# Patient Record
Sex: Female | Born: 1935 | Race: Black or African American | Hispanic: No | Smoking: Former smoker
Health system: Southern US, Community
[De-identification: ages and names within clinical notes are randomized; demographics above are authoritative.]

## PROBLEM LIST (undated history)

## (undated) DIAGNOSIS — G20 Parkinson's disease: Secondary | ICD-10-CM

## (undated) DIAGNOSIS — G20A1 Parkinson's disease without dyskinesia, without mention of fluctuations: Secondary | ICD-10-CM

## (undated) DIAGNOSIS — E119 Type 2 diabetes mellitus without complications: Secondary | ICD-10-CM

## (undated) DIAGNOSIS — D329 Benign neoplasm of meninges, unspecified: Secondary | ICD-10-CM

## (undated) DIAGNOSIS — E785 Hyperlipidemia, unspecified: Secondary | ICD-10-CM

## (undated) DIAGNOSIS — I10 Essential (primary) hypertension: Secondary | ICD-10-CM

## (undated) HISTORY — PX: ABDOMINAL HYSTERECTOMY: SHX81

## (undated) HISTORY — PX: CHOLECYSTECTOMY: SHX55

## (undated) HISTORY — DX: Parkinson's disease: G20

## (undated) HISTORY — DX: Parkinson's disease without dyskinesia, without mention of fluctuations: G20.A1

## (undated) HISTORY — PX: SHOULDER SURGERY: SHX246

## (undated) HISTORY — DX: Hyperlipidemia, unspecified: E78.5

## (undated) HISTORY — DX: Benign neoplasm of meninges, unspecified: D32.9

## (undated) HISTORY — DX: Type 2 diabetes mellitus without complications: E11.9

## (undated) HISTORY — DX: Essential (primary) hypertension: I10

## (undated) HISTORY — PX: APPENDECTOMY: SHX54

---

## 2004-09-05 ENCOUNTER — Ambulatory Visit: Payer: Self-pay

## 2005-09-26 ENCOUNTER — Ambulatory Visit: Payer: Self-pay | Admitting: Family Medicine

## 2007-07-23 ENCOUNTER — Ambulatory Visit: Payer: Self-pay | Admitting: Family Medicine

## 2008-04-11 ENCOUNTER — Ambulatory Visit: Payer: Self-pay | Admitting: Family Medicine

## 2010-03-13 ENCOUNTER — Ambulatory Visit: Payer: Self-pay | Admitting: Family Medicine

## 2011-02-18 ENCOUNTER — Ambulatory Visit: Payer: Self-pay | Admitting: Family Medicine

## 2011-06-25 ENCOUNTER — Ambulatory Visit: Payer: Self-pay | Admitting: Family Medicine

## 2012-09-14 ENCOUNTER — Ambulatory Visit: Payer: Self-pay | Admitting: Family Medicine

## 2012-09-21 ENCOUNTER — Encounter: Payer: Self-pay | Admitting: Family Medicine

## 2012-10-02 ENCOUNTER — Encounter: Payer: Self-pay | Admitting: Family Medicine

## 2012-12-15 ENCOUNTER — Ambulatory Visit: Payer: Self-pay | Admitting: Family Medicine

## 2013-04-20 ENCOUNTER — Ambulatory Visit: Payer: Self-pay | Admitting: Family Medicine

## 2013-05-02 ENCOUNTER — Ambulatory Visit: Payer: Self-pay | Admitting: Family Medicine

## 2013-06-01 ENCOUNTER — Ambulatory Visit: Payer: Self-pay | Admitting: Family Medicine

## 2014-03-02 ENCOUNTER — Ambulatory Visit: Payer: Self-pay | Admitting: Neurology

## 2014-03-09 ENCOUNTER — Ambulatory Visit: Payer: Self-pay | Admitting: Family Medicine

## 2014-09-27 ENCOUNTER — Ambulatory Visit: Payer: Self-pay | Admitting: Family Medicine

## 2015-04-24 ENCOUNTER — Other Ambulatory Visit: Payer: Self-pay | Admitting: Family Medicine

## 2015-04-24 DIAGNOSIS — Z1231 Encounter for screening mammogram for malignant neoplasm of breast: Secondary | ICD-10-CM

## 2015-04-27 ENCOUNTER — Other Ambulatory Visit: Payer: Self-pay | Admitting: Family Medicine

## 2015-04-27 DIAGNOSIS — D329 Benign neoplasm of meninges, unspecified: Secondary | ICD-10-CM

## 2015-05-03 ENCOUNTER — Ambulatory Visit
Admission: RE | Admit: 2015-05-03 | Discharge: 2015-05-03 | Disposition: A | Discharge status: Home / Self Care | Payer: Medicare PPO | Source: Ambulatory Visit | Attending: Family Medicine | Admitting: Family Medicine

## 2015-05-03 DIAGNOSIS — Z1231 Encounter for screening mammogram for malignant neoplasm of breast: Secondary | ICD-10-CM | POA: Insufficient documentation

## 2015-05-08 ENCOUNTER — Ambulatory Visit
Admission: RE | Admit: 2015-05-08 | Discharge: 2015-05-08 | Disposition: A | Discharge status: Home / Self Care | Payer: Medicare PPO | Source: Ambulatory Visit | Attending: Family Medicine | Admitting: Family Medicine

## 2015-05-08 DIAGNOSIS — I739 Peripheral vascular disease, unspecified: Secondary | ICD-10-CM | POA: Insufficient documentation

## 2015-05-08 DIAGNOSIS — R609 Edema, unspecified: Secondary | ICD-10-CM | POA: Insufficient documentation

## 2015-05-08 DIAGNOSIS — D329 Benign neoplasm of meninges, unspecified: Secondary | ICD-10-CM

## 2015-05-08 MED ORDER — GADOBENATE DIMEGLUMINE 529 MG/ML IV SOLN
13.0000 mL | Freq: Once | INTRAVENOUS | Status: AC | PRN
  Administered 2015-05-08: 13 mL via INTRAVENOUS

## 2015-05-09 NOTE — Progress Notes (Signed)
Patient notified

## 2015-06-07 ENCOUNTER — Other Ambulatory Visit: Payer: Self-pay | Admitting: Family Medicine

## 2016-01-18 ENCOUNTER — Other Ambulatory Visit: Payer: Self-pay | Admitting: Family Medicine

## 2016-01-31 ENCOUNTER — Other Ambulatory Visit: Payer: Self-pay

## 2016-02-07 ENCOUNTER — Other Ambulatory Visit: Payer: Self-pay | Admitting: Family Medicine

## 2016-02-07 MED ORDER — LOSARTAN POTASSIUM 50 MG PO TABS: 50.0000 mg | ORAL_TABLET | Freq: Every day | ORAL | Status: DC

## 2016-02-27 ENCOUNTER — Other Ambulatory Visit: Payer: Self-pay | Admitting: Family Medicine

## 2016-03-06 ENCOUNTER — Ambulatory Visit (INDEPENDENT_AMBULATORY_CARE_PROVIDER_SITE_OTHER): Payer: Medicare Other | Admitting: Family Medicine

## 2016-03-06 ENCOUNTER — Encounter: Payer: Self-pay | Admitting: Family Medicine

## 2016-03-06 VITALS — BP 132/68 | HR 99 | Temp 98.0°F | Resp 18 | Ht 60.0 in | Wt 151.3 lb

## 2016-03-06 DIAGNOSIS — E785 Hyperlipidemia, unspecified: Secondary | ICD-10-CM | POA: Insufficient documentation

## 2016-03-06 DIAGNOSIS — G20 Parkinson's disease: Secondary | ICD-10-CM | POA: Insufficient documentation

## 2016-03-06 DIAGNOSIS — I10 Essential (primary) hypertension: Secondary | ICD-10-CM | POA: Insufficient documentation

## 2016-03-06 DIAGNOSIS — E119 Type 2 diabetes mellitus without complications: Secondary | ICD-10-CM

## 2016-03-06 DIAGNOSIS — IMO0001 Reserved for inherently not codable concepts without codable children: Secondary | ICD-10-CM | POA: Insufficient documentation

## 2016-03-06 DIAGNOSIS — E1165 Type 2 diabetes mellitus with hyperglycemia: Secondary | ICD-10-CM

## 2016-03-06 LAB — POCT GLYCOSYLATED HEMOGLOBIN (HGB A1C): HEMOGLOBIN A1C: 9.3

## 2016-03-06 LAB — POCT UA - MICROALBUMIN
ALBUMIN/CREATININE RATIO, URINE, POC: NEGATIVE
CREATININE, POC: NEGATIVE mg/dL
Microalbumin Ur, POC: NEGATIVE mg/L

## 2016-03-06 NOTE — Progress Notes (Signed)
Name: Cathy Barton   MRN: [id number]    DOB: [birth year]   Date:03/06/2016       Progress Note  Subjective  Chief Complaint  Chief Complaint  Patient presents with  . Follow-up    DM / cough    HPI  Diabetes Mellitus: Pt. Presents for recheck of Diabetes Mellitus. Initial diagnosis was over 30 years ago. She is on Metformin 500 mg daily, diet is mostly balanced meals (doesn't always have the appetite for 3 meals). Recently has gained a few pounds.  She does get a regular eye exam.   Past Medical History  Diagnosis Date  . Hyperlipidemia   . Hypertension   . Diabetes mellitus without complication ([place])   . Parkinson's disease ([place])     Followed by Neurology.  . Meningioma [facility])     Past Surgical History  Procedure Laterality Date  . Appendectomy    . Cholecystectomy    . Abdominal hysterectomy    . Shoulder surgery      Family History  Problem Relation Age of Onset  . Breast cancer Mother 67  . Heart disease Father   . Cancer Brother     Colon CA  . Kidney failure Brother   . Cancer Brother     Prostate CA  . Kidney failure Brother   . Kidney failure Sister   . Kidney failure Sister     Social History   Social History  . Marital Status: Widowed    Spouse Name: N/A  . Number of Children: N/A  . Years of Education: N/A   Occupational History  . Not on file.   Social History Main Topics  . Smoking status: Former Research scientist (life sciences)  . Smokeless tobacco: Never Used  . Alcohol Use: No  . Drug Use: No  . Sexual Activity: No   Other Topics Concern  . Not on file   Social History Narrative     Current outpatient prescriptions:  .  aspirin 81 MG tablet, Take by mouth., Disp: , Rfl:  .  carbidopa-levodopa (SINEMET IR) 25-100 MG tablet, Take 3 tablets by mouth 3 (three) times daily. , Disp: , Rfl:  .  losartan (COZAAR) 50 MG tablet, Take 1 tablet (50 mg total) by mouth daily., Disp: 90 tablet, Rfl: 0 .  metFORMIN (GLUCOPHAGE) 500 MG tablet, TAKE 1 TABLET BY MOUTH  DAILY, Disp: 90 tablet, Rfl: 0 .  pravastatin (PRAVACHOL) 40 MG tablet, TK 1 T PO QD, Disp: , Rfl: 7  Allergies  Allergen Reactions  . Codeine Other (See Comments)     Review of Systems  Constitutional: Negative for fever and chills.  Gastrointestinal: Negative for nausea, vomiting and abdominal pain.  Neurological: Negative for dizziness.    Objective  Filed Vitals:   03/06/16 1036  BP: 132/68  Pulse: 99  Temp: 98 F (36.7 C)  TempSrc: Oral  Resp: 18  Height: 5' (1.524 m)  Weight: 151 lb 4.8 oz (68.629 kg)  SpO2: 90%    Physical Exam  Constitutional: She is oriented to person, place, and time and well-developed, well-nourished, and in no distress.  Cardiovascular: Normal rate and regular rhythm.   Pulmonary/Chest: Effort normal and breath sounds normal.  Abdominal: Soft. Bowel sounds are normal.  Musculoskeletal:       Right ankle: She exhibits no swelling.       Left ankle: She exhibits no swelling.  Trace pitting edema bilateral ankles.   Neurological: She is alert and oriented to person, place,  and time.  Nursing note and vitals reviewed.    Assessment & Plan  1. Diabetes mellitus without complication (HCC) 123456 is elevated at 9.3%, consistent with poorly controlled diabetes. Have advised to increase metformin to 500 mg twice daily, obtain liver and kidney function and adjust medication as appropriate. She will likely need a second oral agent. - Comprehensive Metabolic Panel (CMET) - POCT HgB A1C - POCT Glucose (CBG) - POCT UA - Microalbumin  Note updated patient's past medical, surgical, family, and social history into her chart, reviewed her chart from all scripts and updated to reflect any changes. Padraig Nhan Asad A. [facility] 03/06/2016 11:05 AM

## 2016-03-07 LAB — COMPREHENSIVE METABOLIC PANEL
A/G RATIO: 1.3 (ref 1.2–2.2)
ALBUMIN: 4.4 g/dL (ref 3.5–4.7)
ALT: 9 IU/L (ref 0–32)
AST: 14 IU/L (ref 0–40)
Alkaline Phosphatase: 119 IU/L — ABNORMAL HIGH (ref 39–117)
BILIRUBIN TOTAL: 0.5 mg/dL (ref 0.0–1.2)
BUN / CREAT RATIO: 13 (ref 12–28)
BUN: 12 mg/dL (ref 8–27)
CHLORIDE: 98 mmol/L (ref 96–106)
CO2: 23 mmol/L (ref 18–29)
Calcium: 10 mg/dL (ref 8.7–10.3)
Creatinine, Ser: 0.95 mg/dL (ref 0.57–1.00)
GFR calc non Af Amer: 56 mL/min/{1.73_m2} — ABNORMAL LOW (ref >59)
GFR, EST AFRICAN AMERICAN: 64 mL/min/{1.73_m2} (ref >59)
GLUCOSE: 197 mg/dL — AB (ref 65–99)
Globulin, Total: 3.5 g/dL (ref 1.5–4.5)
Potassium: 4.1 mmol/L (ref 3.5–5.2)
Sodium: 140 mmol/L (ref 134–144)
TOTAL PROTEIN: 7.9 g/dL (ref 6.0–8.5)

## 2016-03-13 ENCOUNTER — Ambulatory Visit (INDEPENDENT_AMBULATORY_CARE_PROVIDER_SITE_OTHER): Payer: Medicare Other | Admitting: Family Medicine

## 2016-03-13 ENCOUNTER — Encounter: Payer: Self-pay | Admitting: Family Medicine

## 2016-03-13 VITALS — BP 122/80 | HR 103 | Temp 97.9°F | Resp 16 | Ht 60.0 in | Wt 152.6 lb

## 2016-03-13 DIAGNOSIS — IMO0001 Reserved for inherently not codable concepts without codable children: Secondary | ICD-10-CM

## 2016-03-13 DIAGNOSIS — E1165 Type 2 diabetes mellitus with hyperglycemia: Secondary | ICD-10-CM

## 2016-03-13 DIAGNOSIS — E785 Hyperlipidemia, unspecified: Secondary | ICD-10-CM

## 2016-03-13 MED ORDER — SITAGLIPTIN PHOSPHATE 50 MG PO TABS: 50.0000 mg | ORAL_TABLET | Freq: Every day | ORAL | Status: DC

## 2016-03-13 MED ORDER — METFORMIN HCL 500 MG PO TABS: 500.0000 mg | ORAL_TABLET | Freq: Two times a day (BID) | ORAL | Status: DC

## 2016-03-13 NOTE — Progress Notes (Signed)
Name: Cathy Barton   MRN: [id number]    DOB: 30-Jul-1930   Date:03/13/2016       Progress Note  Subjective  Chief Complaint  Chief Complaint  Patient presents with  . Follow up labs     1 week recheck    HPI  Diabetes Mellitus Type 2: Pt. Presents for follow up of type 2 Diabetes, poorly controlled (A1c 9.3%). She has been taking Metformin 500 mg twice daily. Will add a second agent to her regimen.   Past Medical History  Diagnosis Date  . Hyperlipidemia   . Hypertension   . Diabetes mellitus without complication ([place])   . Parkinson's disease ([place])     Followed by Neurology.  . Meningioma [facility])     Past Surgical History  Procedure Laterality Date  . Appendectomy    . Cholecystectomy    . Abdominal hysterectomy    . Shoulder surgery      Family History  Problem Relation Age of Onset  . Breast cancer Mother 77  . Heart disease Father   . Cancer Brother     Colon CA  . Kidney failure Brother   . Cancer Brother     Prostate CA  . Kidney failure Brother   . Kidney failure Sister   . Kidney failure Sister     Social History   Social History  . Marital Status: Widowed    Spouse Name: N/A  . Number of Children: N/A  . Years of Education: N/A   Occupational History  . Not on file.   Social History Main Topics  . Smoking status: Former Research scientist (life sciences)  . Smokeless tobacco: Never Used  . Alcohol Use: No  . Drug Use: No  . Sexual Activity: No   Other Topics Concern  . Not on file   Social History Narrative     Current outpatient prescriptions:  .  aspirin 81 MG tablet, Take by mouth., Disp: , Rfl:  .  carbidopa-levodopa (SINEMET IR) 25-100 MG tablet, Take 3 tablets by mouth 3 (three) times daily. , Disp: , Rfl:  .  losartan (COZAAR) 50 MG tablet, Take 1 tablet (50 mg total) by mouth daily., Disp: 90 tablet, Rfl: 0 .  metFORMIN (GLUCOPHAGE) 500 MG tablet, TAKE 1 TABLET BY MOUTH DAILY, Disp: 90 tablet, Rfl: 0 .  pravastatin (PRAVACHOL) 40 MG tablet, TK 1 T PO  QD, Disp: , Rfl: 7  Allergies  Allergen Reactions  . Codeine Other (See Comments)     Review of Systems  Constitutional: Negative for fever, chills and malaise/fatigue.  Eyes: Negative for blurred vision.  Gastrointestinal: Negative for nausea, vomiting and abdominal pain.    Objective  Filed Vitals:   03/13/16 1341  BP: 122/80  Pulse: 103  Temp: 97.9 F (36.6 C)  TempSrc: Oral  Resp: 16  Height: 5' (1.524 m)  Weight: 152 lb 9.6 oz (69.219 kg)  SpO2: 96%    Physical Exam  Constitutional: She is oriented to person, place, and time and well-developed, well-nourished, and in no distress.  Cardiovascular: Normal rate and regular rhythm.   Pulmonary/Chest: Effort normal and breath sounds normal.  Abdominal: Soft. Bowel sounds are normal.  Neurological: She is alert and oriented to person, place, and time.  Nursing note and vitals reviewed.       Assessment & Plan  1. Hyperlipidemia Recheck FLP. - Lipid Profile  2. Uncontrolled type 2 diabetes mellitus without complication, without long-term current use of insulin ([place]) We'll add Januvia  50 mg daily to patient's diabetic regimen. Have already increased metformin to 500 mg twice daily. Recheck A1c in 3-4 months. - sitaGLIPtin (JANUVIA) 50 MG tablet; Take 1 tablet (50 mg total) by mouth daily.  Dispense: 30 tablet; Refill: 2 - metFORMIN (GLUCOPHAGE) 500 MG tablet; Take 1 tablet (500 mg total) by mouth 2 (two) times daily with a meal.  Dispense: 180 tablet; Refill: 0    Theran Vandergrift Asad A. [facility] 03/13/2016 2:13 PM

## 2016-03-25 ENCOUNTER — Other Ambulatory Visit: Payer: Self-pay | Admitting: Family Medicine

## 2016-03-28 LAB — LIPID PANEL
CHOLESTEROL TOTAL: 132 mg/dL (ref 100–199)
Chol/HDL Ratio: 2.6 ratio units (ref 0.0–4.4)
HDL: 50 mg/dL (ref >39)
LDL CALC: 64 mg/dL (ref 0–99)
TRIGLYCERIDES: 91 mg/dL (ref 0–149)
VLDL Cholesterol Cal: 18 mg/dL (ref 5–40)

## 2016-05-01 ENCOUNTER — Other Ambulatory Visit: Payer: Self-pay | Admitting: Family Medicine

## 2016-05-11 ENCOUNTER — Other Ambulatory Visit: Payer: Self-pay | Admitting: Family Medicine

## 2016-05-15 NOTE — Telephone Encounter (Signed)
Medication has been refilled and sent to [facility] [place]

## 2016-05-15 NOTE — Telephone Encounter (Signed)
LOSARTAN IS NEEDING REFILL SAYS THAT THE PHARM HAS SENT REQUEST WITHOUT AND RESPONSE. PHARM IS [place]. PT HAS SEEN THE DR SEVERAL TIMES ALREADY PER PT AND HAS ANOTHER FU IN July WITH HIM

## 2016-05-24 ENCOUNTER — Other Ambulatory Visit: Payer: Self-pay | Admitting: Family Medicine

## 2016-05-24 DIAGNOSIS — Z1231 Encounter for screening mammogram for malignant neoplasm of breast: Secondary | ICD-10-CM

## 2016-06-06 ENCOUNTER — Ambulatory Visit
Admission: RE | Admit: 2016-06-06 | Discharge: 2016-06-06 | Disposition: A | Discharge status: Home / Self Care | Payer: Medicare Other | Source: Ambulatory Visit | Attending: Family Medicine | Admitting: Family Medicine

## 2016-06-06 DIAGNOSIS — Z1231 Encounter for screening mammogram for malignant neoplasm of breast: Secondary | ICD-10-CM | POA: Diagnosis present

## 2016-06-07 ENCOUNTER — Other Ambulatory Visit: Payer: Self-pay | Admitting: Family Medicine

## 2016-06-17 ENCOUNTER — Ambulatory Visit (INDEPENDENT_AMBULATORY_CARE_PROVIDER_SITE_OTHER): Payer: Medicare Other | Admitting: Family Medicine

## 2016-06-17 ENCOUNTER — Encounter: Payer: Self-pay | Admitting: Family Medicine

## 2016-06-17 ENCOUNTER — Other Ambulatory Visit: Payer: Self-pay | Admitting: Family Medicine

## 2016-06-17 DIAGNOSIS — IMO0001 Reserved for inherently not codable concepts without codable children: Secondary | ICD-10-CM

## 2016-06-17 DIAGNOSIS — I10 Essential (primary) hypertension: Secondary | ICD-10-CM | POA: Diagnosis not present

## 2016-06-17 DIAGNOSIS — E785 Hyperlipidemia, unspecified: Secondary | ICD-10-CM

## 2016-06-17 DIAGNOSIS — E1165 Type 2 diabetes mellitus with hyperglycemia: Secondary | ICD-10-CM

## 2016-06-17 LAB — POCT GLYCOSYLATED HEMOGLOBIN (HGB A1C): Hemoglobin A1C: 8

## 2016-06-17 LAB — GLUCOSE, POCT (MANUAL RESULT ENTRY): POC GLUCOSE: 192 mg/dL — AB (ref 70–99)

## 2016-06-17 MED ORDER — METFORMIN HCL ER (OSM) 500 MG PO TB24: 500.0000 mg | ORAL_TABLET | Freq: Every day | ORAL | Status: DC

## 2016-06-17 NOTE — Progress Notes (Signed)
Name: Cathy Barton   MRN: [id number]    DOB: 06-Feb-1930   Date:06/17/2016       Progress Note  Subjective  Chief Complaint  Chief Complaint  Patient presents with  . Hypertension    3 month recheck  . Diabetes    Not taking Januvia  . Hyperlipidemia    Hypertension This is a chronic problem. The problem is unchanged. The problem is controlled. Associated symptoms include headaches (occasional headache early in AM.). Pertinent negatives include no blurred vision, chest pain, palpitations or shortness of breath. Past treatments include angiotensin blockers. There is no history of CVA.  Diabetes She presents for her follow-up diabetic visit. She has type 2 diabetes mellitus. Her disease course has been improving. Hypoglycemia symptoms include headaches (occasional headache early in AM.). Pertinent negatives for diabetes include no blurred vision, no chest pain, no fatigue, no polydipsia, no polyuria and no visual change. Pertinent negatives for diabetic complications include no CVA, heart disease or peripheral neuropathy. Current diabetic treatment includes oral agent (dual therapy). She monitors blood glucose at home 1-2 x per day. Her breakfast blood glucose range is generally 110-130 mg/dl. An ACE inhibitor/angiotensin II receptor blocker is being taken.  Hyperlipidemia This is a chronic problem. The problem is controlled. Recent lipid tests were reviewed and are normal. Pertinent negatives include no chest pain, leg pain, myalgias or shortness of breath. Current antihyperlipidemic treatment includes statins.    Past Medical History  Diagnosis Date  . Hyperlipidemia   . Hypertension   . Diabetes mellitus without complication ([place])   . Parkinson's disease ([place])     Followed by Neurology.  . Meningioma [facility])     Past Surgical History  Procedure Laterality Date  . Appendectomy    . Cholecystectomy    . Abdominal hysterectomy    . Shoulder surgery      Family History  Problem  Relation Age of Onset  . Breast cancer Mother 64  . Heart disease Father   . Cancer Brother     Colon CA  . Kidney failure Brother   . Cancer Brother     Prostate CA  . Kidney failure Brother   . Kidney failure Sister   . Kidney failure Sister     Social History   Social History  . Marital Status: Widowed    Spouse Name: N/A  . Number of Children: N/A  . Years of Education: N/A   Occupational History  . Not on file.   Social History Main Topics  . Smoking status: Former Research scientist (life sciences)  . Smokeless tobacco: Never Used  . Alcohol Use: No  . Drug Use: No  . Sexual Activity: No   Other Topics Concern  . Not on file   Social History Narrative     Current outpatient prescriptions:  .  aspirin 81 MG tablet, Take by mouth., Disp: , Rfl:  .  carbidopa-levodopa (SINEMET IR) 25-100 MG tablet, Take 3 tablets by mouth 3 (three) times daily. , Disp: , Rfl:  .  losartan (COZAAR) 50 MG tablet, Take 1 tablet (50 mg total) by mouth daily., Disp: 90 tablet, Rfl: 0 .  metformin (FORTAMET) 500 MG (OSM) 24 hr tablet, TAKE 1 TABLET BY MOUTH EVERY DAY, Disp: 30 tablet, Rfl: 0 .  pravastatin (PRAVACHOL) 40 MG tablet, TAKE 1 TABLET BY MOUTH EVERY DAY, Disp: 30 tablet, Rfl: 0 .  sitaGLIPtin (JANUVIA) 50 MG tablet, Take 1 tablet (50 mg total) by mouth daily. (Patient not taking: Reported  on 06/17/2016), Disp: 30 tablet, Rfl: 2  Allergies  Allergen Reactions  . Codeine Other (See Comments)     Review of Systems  Constitutional: Negative for fatigue.  Eyes: Negative for blurred vision.  Respiratory: Negative for shortness of breath.   Cardiovascular: Negative for chest pain and palpitations.  Musculoskeletal: Negative for myalgias.  Neurological: Positive for headaches (occasional headache early in AM.).  Endo/Heme/Allergies: Negative for polydipsia.    Objective  Filed Vitals:   06/17/16 1006  BP: 124/68  Pulse: 100  Temp: 98.1 F (36.7 C)  TempSrc: Oral  Resp: 16  Height: 5' (1.524  m)  Weight: 144 lb 1.6 oz (65.363 kg)  SpO2: 95%    Physical Exam  Constitutional: She is oriented to person, place, and time and well-developed, well-nourished, and in no distress.  HENT:  Head: Normocephalic and atraumatic.  Cardiovascular: Normal rate, regular rhythm and normal heart sounds.   No murmur heard. Pulmonary/Chest: Effort normal and breath sounds normal. She has no wheezes.  Abdominal: Soft. Bowel sounds are normal. There is no tenderness.  Musculoskeletal: Normal range of motion. She exhibits no edema or tenderness.  Neurological: She is alert and oriented to person, place, and time.  Psychiatric: Mood, memory, affect and judgment normal.  Nursing note and vitals reviewed.     Assessment & Plan  1. Uncontrolled type 2 diabetes mellitus without complication, without long-term current use of insulin (HCC)  A1c has improved from 9.3% to 8.0%, appropriate and stable for this patient. Advised to continue on Januvia and metformin as prescribed. Recheck A1c in 3-4 months  - POCT HgB A1C - POCT Glucose (CBG) - metformin (FORTAMET) 500 MG (OSM) 24 hr tablet; Take 1 tablet (500 mg total) by mouth daily with breakfast.  Dispense: 90 tablet; Refill: 0  2. Essential hypertension BP stable and responsive to antihypertensive therapy  3. Hyperlipidemia FLP at goal, continue statin therapy.    Kaylena Pacifico Asad A. [facility] 06/17/2016 10:17 AM

## 2016-06-17 NOTE — Telephone Encounter (Signed)
Patient notified of results.

## 2016-07-13 ENCOUNTER — Other Ambulatory Visit: Payer: Self-pay | Admitting: Family Medicine

## 2016-07-13 DIAGNOSIS — E1165 Type 2 diabetes mellitus with hyperglycemia: Principal | ICD-10-CM

## 2016-07-13 DIAGNOSIS — IMO0001 Reserved for inherently not codable concepts without codable children: Secondary | ICD-10-CM

## 2016-08-13 ENCOUNTER — Other Ambulatory Visit: Payer: Self-pay | Admitting: Family Medicine

## 2016-09-18 ENCOUNTER — Ambulatory Visit: Payer: Medicare Other | Admitting: Family Medicine

## 2016-09-18 ENCOUNTER — Encounter: Payer: Self-pay | Admitting: Family Medicine

## 2016-09-18 ENCOUNTER — Ambulatory Visit (INDEPENDENT_AMBULATORY_CARE_PROVIDER_SITE_OTHER): Payer: Medicare Other | Admitting: Family Medicine

## 2016-09-18 VITALS — BP 121/80 | HR 97 | Temp 97.7°F | Resp 16 | Ht 60.0 in | Wt 143.5 lb

## 2016-09-18 DIAGNOSIS — E1165 Type 2 diabetes mellitus with hyperglycemia: Secondary | ICD-10-CM | POA: Diagnosis not present

## 2016-09-18 DIAGNOSIS — E785 Hyperlipidemia, unspecified: Secondary | ICD-10-CM | POA: Diagnosis not present

## 2016-09-18 DIAGNOSIS — Z23 Encounter for immunization: Secondary | ICD-10-CM | POA: Diagnosis not present

## 2016-09-18 DIAGNOSIS — I10 Essential (primary) hypertension: Secondary | ICD-10-CM | POA: Diagnosis not present

## 2016-09-18 DIAGNOSIS — IMO0001 Reserved for inherently not codable concepts without codable children: Secondary | ICD-10-CM

## 2016-09-18 LAB — GLUCOSE, POCT (MANUAL RESULT ENTRY): POC Glucose: 142 mg/dl — AB (ref 70–99)

## 2016-09-18 LAB — POCT GLYCOSYLATED HEMOGLOBIN (HGB A1C): Hemoglobin A1C: 7.9

## 2016-09-18 MED ORDER — SITAGLIPTIN PHOSPHATE 50 MG PO TABS: ORAL_TABLET | ORAL | 0 refills | Status: DC

## 2016-09-18 NOTE — Progress Notes (Signed)
Name: Cathy Barton   MRN: [id number]    DOB: 04/26/1930   Date:09/18/2016       Progress Note  Subjective  Chief Complaint  Chief Complaint  Patient presents with  . Follow-up    3 mo    Hypertension  This is a chronic problem. The problem is unchanged. The problem is controlled. Pertinent negatives include no blurred vision, chest pain, headaches (occasional headache early in AM.), palpitations or shortness of breath. Past treatments include angiotensin blockers. There is no history of CAD/MI or CVA.  Diabetes  She presents for her follow-up diabetic visit. She has type 2 diabetes mellitus. Her disease course has been improving. Pertinent negatives for hypoglycemia include no headaches (occasional headache early in AM.). Pertinent negatives for diabetes include no blurred vision, no chest pain, no fatigue, no polydipsia, no polyuria and no visual change. Pertinent negatives for diabetic complications include no CVA, heart disease or peripheral neuropathy. Current diabetic treatment includes oral agent (dual therapy). She is following a diabetic diet. She monitors blood glucose at home 1-2 x per day. Her breakfast blood glucose range is generally 110-130 mg/dl. An ACE inhibitor/angiotensin II receptor blocker is being taken.  Hyperlipidemia  This is a chronic problem. The problem is controlled. Recent lipid tests were reviewed and are normal. Pertinent negatives include no chest pain, leg pain, myalgias or shortness of breath. Current antihyperlipidemic treatment includes statins.     Past Medical History:  Diagnosis Date  . Diabetes mellitus without complication ([place])   . Hyperlipidemia   . Hypertension   . Meningioma ([place])   . Parkinson's disease ([place])    Followed by Neurology.    Past Surgical History:  Procedure Laterality Date  . ABDOMINAL HYSTERECTOMY    . APPENDECTOMY    . CHOLECYSTECTOMY    . SHOULDER SURGERY      Family History  Problem Relation Age of Onset  .  Breast cancer Mother 79  . Heart disease Father   . Cancer Brother     Colon CA  . Kidney failure Brother   . Cancer Brother     Prostate CA  . Kidney failure Brother   . Kidney failure Sister   . Kidney failure Sister     Social History   Social History  . Marital status: Widowed    Spouse name: N/A  . Number of children: N/A  . Years of education: N/A   Occupational History  . Not on file.   Social History Main Topics  . Smoking status: Former Research scientist (life sciences)  . Smokeless tobacco: Never Used  . Alcohol use No  . Drug use: No  . Sexual activity: No   Other Topics Concern  . Not on file   Social History Narrative  . No narrative on file     Current Outpatient Prescriptions:  .  aspirin 81 MG tablet, Take by mouth., Disp: , Rfl:  .  JANUVIA 50 MG tablet, TAKE 1 TABLET(50 MG) BY MOUTH DAILY, Disp: 30 tablet, Rfl: 0 .  losartan (COZAAR) 50 MG tablet, TAKE 1 TABLET(50 MG) BY MOUTH DAILY, Disp: 90 tablet, Rfl: 0 .  metformin (FORTAMET) 500 MG (OSM) 24 hr tablet, Take 1 tablet (500 mg total) by mouth daily with breakfast., Disp: 90 tablet, Rfl: 0 .  pravastatin (PRAVACHOL) 40 MG tablet, TAKE 1 TABLET BY MOUTH EVERY DAY, Disp: 30 tablet, Rfl: 2 .  carbidopa-levodopa (SINEMET IR) 25-100 MG tablet, Take 3 tablets by mouth 3 (three) times daily. ,  Disp: , Rfl:   Allergies  Allergen Reactions  . Codeine Other (See Comments)     Review of Systems  Constitutional: Negative for fatigue.  Eyes: Negative for blurred vision.  Respiratory: Negative for shortness of breath.   Cardiovascular: Negative for chest pain and palpitations.  Musculoskeletal: Negative for myalgias.  Neurological: Negative for headaches (occasional headache early in AM.).  Endo/Heme/Allergies: Negative for polydipsia.     Objective  Vitals:   09/18/16 1018  BP: 121/80  Pulse: 97  Resp: 16  Temp: 97.7 F (36.5 C)  TempSrc: Oral  SpO2: 97%  Weight: 143 lb 8 oz (65.1 kg)  Height: 5' (1.524 m)     Physical Exam  Constitutional: She is oriented to person, place, and time and well-developed, well-nourished, and in no distress.  HENT:  Head: Normocephalic and atraumatic.  Cardiovascular: Normal rate, regular rhythm, S1 normal, S2 normal and normal heart sounds.   No murmur heard. Pulmonary/Chest: Effort normal and breath sounds normal. She has no wheezes. She has no rhonchi.  Abdominal: Soft. Bowel sounds are normal. There is no tenderness.  Musculoskeletal: Normal range of motion. She exhibits no edema or tenderness.  Neurological: She is alert and oriented to person, place, and time. She displays tremor (resting tremor at 4-5 Hz in both UEs).  Psychiatric: Mood, memory, affect and judgment normal.  Nursing note and vitals reviewed.    Assessment & Plan 1. Uncontrolled type 2 diabetes mellitus without complication, without long-term current use of insulin (HCC) A1c is improving, now at 7.9%, no change in pharmacotherapy. - sitaGLIPtin (JANUVIA) 50 MG tablet; TAKE 1 TABLET(50 MG) BY MOUTH DAILY  Dispense: 90 tablet; Refill: 0  2. Essential hypertension BP stable and controlled on present anti- hypertensive therapy  3. Hyperlipidemia, unspecified hyperlipidemia type FLP at goal from April 2017, repeat in 3 months   Ayaka Andes Asad A. [facility] 09/18/2016 10:29 AM

## 2016-10-01 ENCOUNTER — Ambulatory Visit: Payer: Medicare Other | Admitting: Family Medicine

## 2016-10-02 ENCOUNTER — Other Ambulatory Visit: Payer: Self-pay | Admitting: Neurology

## 2016-10-02 DIAGNOSIS — D320 Benign neoplasm of cerebral meninges: Secondary | ICD-10-CM

## 2016-10-02 DIAGNOSIS — R51 Headache: Secondary | ICD-10-CM

## 2016-10-02 DIAGNOSIS — R519 Headache, unspecified: Secondary | ICD-10-CM

## 2016-10-14 ENCOUNTER — Ambulatory Visit
Admission: RE | Admit: 2016-10-14 | Discharge: 2016-10-14 | Disposition: A | Discharge status: Home / Self Care | Payer: Medicare Other | Source: Ambulatory Visit | Attending: Neurology | Admitting: Neurology

## 2016-10-14 DIAGNOSIS — I6782 Cerebral ischemia: Secondary | ICD-10-CM | POA: Insufficient documentation

## 2016-10-14 DIAGNOSIS — D320 Benign neoplasm of cerebral meninges: Secondary | ICD-10-CM | POA: Diagnosis present

## 2016-10-14 DIAGNOSIS — R519 Headache, unspecified: Secondary | ICD-10-CM

## 2016-10-14 DIAGNOSIS — R51 Headache: Secondary | ICD-10-CM | POA: Insufficient documentation

## 2016-10-14 DIAGNOSIS — G936 Cerebral edema: Secondary | ICD-10-CM | POA: Diagnosis not present

## 2016-10-14 LAB — POCT I-STAT CREATININE: CREATININE: 1 mg/dL (ref 0.44–1.00)

## 2016-10-14 MED ORDER — GADOBENATE DIMEGLUMINE 529 MG/ML IV SOLN
15.0000 mL | Freq: Once | INTRAVENOUS | Status: AC | PRN
  Administered 2016-10-14: 15 mL via INTRAVENOUS

## 2016-10-21 ENCOUNTER — Ambulatory Visit (INDEPENDENT_AMBULATORY_CARE_PROVIDER_SITE_OTHER): Payer: Medicare Other | Admitting: Family Medicine

## 2016-10-21 VITALS — BP 162/72 | HR 92 | Temp 97.5°F | Ht 60.0 in | Wt 144.2 lb

## 2016-10-21 DIAGNOSIS — Z0000 Encounter for general adult medical examination without abnormal findings: Secondary | ICD-10-CM

## 2016-10-21 NOTE — Patient Instructions (Signed)
Ms. Magill , Thank you for taking time to come for your Medicare Wellness Visit. I appreciate your ongoing commitment to your health goals. Please review the following plan we discussed and let me know if I can assist you in the future.   These are the goals we discussed: Goals    . Increase water intake          Starting 10/21/16, I will increase my water intake to 6 glasses a day.       This is a list of the screening recommended for you and due dates:  Health Maintenance  Topic Date Due  . Eye exam for diabetics  12/01/2016*  . Pneumonia vaccines (1 of 2 - PCV13) 12/01/2016*  . Shingles Vaccine  10/21/2026*  . Hemoglobin A1C  03/19/2017  . Complete foot exam   09/18/2017  . Tetanus Vaccine  08/27/2022  . Flu Shot  Completed  . DEXA scan (bone density measurement)  Completed  *Topic was postponed. The date shown is not the original due date.   Preventive Care for Adults  A healthy lifestyle and preventive care can promote health and wellness. Preventive health guidelines for adults include the following key practices.  . A routine yearly physical is a good way to check with your health care provider about your health and preventive screening. It is a chance to share any concerns and updates on your health and to receive a thorough exam.  . Visit your dentist for a routine exam and preventive care every 6 months. Brush your teeth twice a day and floss once a day. Good oral hygiene prevents tooth decay and gum disease.  . The frequency of eye exams is based on your age, health, family medical history, use  of contact lenses, and other factors. Follow your health care provider's ecommendations for frequency of eye exams.  . Eat a healthy diet. Foods like vegetables, fruits, whole grains, low-fat dairy products, and lean protein foods contain the nutrients you need without too many calories. Decrease your intake of foods high in solid fats, added sugars, and salt. Eat the right  amount of calories for you. Get information about a proper diet from your health care provider, if necessary.  . Regular physical exercise is one of the most important things you can do for your health. Most adults should get at least 150 minutes of moderate-intensity exercise (any activity that increases your heart rate and causes you to sweat) each week. In addition, most adults need muscle-strengthening exercises on 2 or more days a week.  Silver Sneakers may be a benefit available to you. To determine eligibility, you may visit the website: www.silversneakers.com or contact program at 226-075-9588 Mon-Fri between 8AM-8PM.   . Maintain a healthy weight. The body mass index (BMI) is a screening tool to identify possible weight problems. It provides an estimate of body fat based on height and weight. Your health care provider can find your BMI and can help you achieve or maintain a healthy weight.   For adults 20 years and older: ? A BMI below 18.5 is considered underweight. ? A BMI of 18.5 to 24.9 is normal. ? A BMI of 25 to 29.9 is considered overweight. ? A BMI of 30 and above is considered obese.   . Maintain normal blood lipids and cholesterol levels by exercising and minimizing your intake of saturated fat. Eat a balanced diet with plenty of fruit and vegetables. Blood tests for lipids and cholesterol should begin at  age 17 and be repeated every 5 years. If your lipid or cholesterol levels are high, you are over 50, or you are at high risk for heart disease, you may need your cholesterol levels checked more frequently. Ongoing high lipid and cholesterol levels should be treated with medicines if diet and exercise are not working.  . If you smoke, find out from your health care provider how to quit. If you do not use tobacco, please do not start.  . If you choose to drink alcohol, please do not consume more than 2 drinks per day. One drink is considered to be 12 ounces (355 mL) of beer, 5  ounces (148 mL) of wine, or 1.5 ounces (44 mL) of liquor.  . If you are 52-13 years old, ask your health care provider if you should take aspirin to prevent strokes.  . Use sunscreen. Apply sunscreen liberally and repeatedly throughout the day. You should seek shade when your shadow is shorter than you. Protect yourself by wearing long sleeves, pants, a wide-brimmed hat, and sunglasses year round, whenever you are outdoors.  . Once a month, do a whole body skin exam, using a mirror to look at the skin on your back. Tell your health care provider of new moles, moles that have irregular borders, moles that are larger than a pencil eraser, or moles that have changed in shape or color.

## 2016-10-21 NOTE — Progress Notes (Signed)
Subjective:   Cathy Barton is a 82 y.o. female who presents for Medicare Annual (Subsequent) preventive examination.  Review of Systems:  N/A  Cardiac Risk Factors include: advanced age (>76men, >43 women);diabetes mellitus;dyslipidemia;hypertension     Objective:     Vitals: BP (!) 162/72 (BP Location: Right Arm)   Pulse 92   Temp 97.5 F (36.4 C) (Oral)   Ht 5' (1.524 m)   Wt 144 lb 4 oz (65.4 kg)   BMI 28.17 kg/m   Body mass index is 28.17 kg/m.   Tobacco History  Smoking Status  . Former Smoker  . Types: Cigarettes  Smokeless Tobacco  . Never Used    Comment: for a short time period     Counseling given: Not Answered   Past Medical History:  Diagnosis Date  . Diabetes mellitus without complication ([place])   . Hyperlipidemia   . Hypertension   . Meningioma ([place])   . Parkinson's disease ([place])    Followed by Neurology.   Past Surgical History:  Procedure Laterality Date  . ABDOMINAL HYSTERECTOMY    . APPENDECTOMY    . CHOLECYSTECTOMY    . SHOULDER SURGERY     Family History  Problem Relation Age of Onset  . Breast cancer Mother 53  . Heart disease Father   . Cancer Brother     Colon CA  . Kidney failure Brother   . Cancer Brother     Prostate CA  . Kidney failure Brother   . Kidney failure Sister   . Kidney failure Sister    History  Sexual Activity  . Sexual activity: No    Outpatient Encounter Prescriptions as of 10/21/2016  Medication Sig  . aspirin 81 MG tablet Take by mouth.  . losartan (COZAAR) 50 MG tablet TAKE 1 TABLET(50 MG) BY MOUTH DAILY  . metformin (FORTAMET) 500 MG (OSM) 24 hr tablet Take 1 tablet (500 mg total) by mouth daily with breakfast.  . pravastatin (PRAVACHOL) 40 MG tablet TAKE 1 TABLET BY MOUTH EVERY DAY  . sitaGLIPtin (JANUVIA) 50 MG tablet TAKE 1 TABLET(50 MG) BY MOUTH DAILY  . carbidopa-levodopa (SINEMET IR) 25-100 MG tablet Take 3 tablets by mouth 3 (three) times daily.    No facility-administered  encounter medications on file as of 10/21/2016.     Activities of Daily Living In your present state of health, do you have any difficulty performing the following activities: 10/21/2016 09/18/2016  Hearing? Tempie Donning  Vision? N Y  Difficulty concentrating or making decisions? N N  Walking or climbing stairs? Y N  Dressing or bathing? N N  Doing errands, shopping? N N  Preparing Food and eating ? N -  Using the Toilet? N -  In the past six months, have you accidently leaked urine? Y -  Do you have problems with loss of bowel control? N -  Managing your Medications? N -  Managing your Finances? N -  Housekeeping or managing your Housekeeping? N -  Some recent data might be hidden    Patient Care Team: Roselee Nova, MD as PCP - General (Family Medicine) Vladimir Crofts, MD as Consulting Physician (Neurology)    Assessment:     Exercise Activities and Dietary recommendations Current Exercise Habits: Home exercise routine, Type of exercise: walking, Time (Minutes): 45, Frequency (Times/Week): 3, Weekly Exercise (Minutes/Week): 135, Intensity: Mild  Goals    . Increase water intake          Starting  10/21/16, I will increase my water intake to 6 glasses a day.      Fall Risk Fall Risk  10/21/2016 09/18/2016 06/17/2016 03/13/2016 03/06/2016  Falls in the past year? No No No No No   Depression Screen PHQ 2/9 Scores 10/21/2016 09/18/2016 06/17/2016 03/13/2016  PHQ - 2 Score 0 0 0 0     Cognitive Function     6CIT Screen 10/21/2016  What Year? 0 points  What month? 0 points  What time? 0 points  Count back from 20 0 points  Months in reverse 0 points  Repeat phrase 6 points  Total Score 6    Immunization History  Administered Date(s) Administered  . Influenza, High Dose Seasonal PF 09/18/2016  . Influenza-Unspecified 08/02/2014  . Pneumococcal Polysaccharide-23 05/02/2013  . Tdap 08/27/2012   Screening Tests Health Maintenance  Topic Date Due  . OPHTHALMOLOGY EXAM   12/01/2016 (Originally 04/25/1940)  . PNA vac Low Risk Adult (1 of 2 - PCV13) 12/01/2016 (Originally 04/25/1997)  . ZOSTAVAX  10/21/2026 (Originally 04/25/1992)  . HEMOGLOBIN A1C  03/19/2017  . FOOT EXAM  09/18/2017  . TETANUS/TDAP  08/27/2022  . INFLUENZA VACCINE  Completed  . DEXA SCAN  Completed      Plan:  I have personally reviewed and addressed the Medicare Annual Wellness questionnaire and have noted the following in the patient's chart:  A. Medical and social history B. Use of alcohol, tobacco or illicit drugs  C. Current medications and supplements D. Functional ability and status E.  Nutritional status F.  Physical activity G. Advance directives H. List of other physicians I.  Hospitalizations, surgeries, and ER visits in previous 12 months J.  [place] such as hearing and vision if needed, cognitive and depression L. Referrals and appointments - none  In addition, I have reviewed and discussed with patient certain preventive protocols, quality metrics, and best practice recommendations. A written personalized care plan for preventive services as well as general preventive health recommendations were provided to patient.  See attached scanned questionnaire for additional information.   Signed,  Fabio Neighbors, LPN Nurse Health Advisor   MD Recommendation: Follow up on eye exam and documentation of Prevnar 13 vaccine. I, as supervising physician, have reviewed the nurse health advisor's Medicare Wellness Visit note for this patient and concur with the findings and recommendations listed above.  Signed Syed Asad A. Manuella Ghazi MD Attending Physician.

## 2016-11-26 ENCOUNTER — Other Ambulatory Visit: Payer: Self-pay | Admitting: Family Medicine

## 2016-11-29 ENCOUNTER — Other Ambulatory Visit: Payer: Self-pay | Admitting: Family Medicine

## 2016-11-29 DIAGNOSIS — E1165 Type 2 diabetes mellitus with hyperglycemia: Principal | ICD-10-CM

## 2016-11-29 DIAGNOSIS — IMO0001 Reserved for inherently not codable concepts without codable children: Secondary | ICD-10-CM

## 2017-01-10 ENCOUNTER — Ambulatory Visit (INDEPENDENT_AMBULATORY_CARE_PROVIDER_SITE_OTHER): Payer: Medicare Other | Admitting: Family Medicine

## 2017-01-10 ENCOUNTER — Encounter: Payer: Self-pay | Admitting: Family Medicine

## 2017-01-10 DIAGNOSIS — J069 Acute upper respiratory infection, unspecified: Secondary | ICD-10-CM | POA: Diagnosis not present

## 2017-01-10 LAB — POCT INFLUENZA A/B
INFLUENZA A, POC: NEGATIVE
INFLUENZA B, POC: NEGATIVE

## 2017-01-10 MED ORDER — AMOXICILLIN-POT CLAVULANATE 875-125 MG PO TABS: 1.0000 | ORAL_TABLET | Freq: Two times a day (BID) | ORAL | 0 refills | Status: DC

## 2017-01-10 MED ORDER — BENZONATATE 200 MG PO CAPS: 200.0000 mg | ORAL_CAPSULE | Freq: Two times a day (BID) | ORAL | 0 refills | Status: AC | PRN

## 2017-01-10 NOTE — Progress Notes (Signed)
Name: Cathy Barton   MRN: [id number]    DOB: [birth year]   Date:01/10/2017       Progress Note  Subjective  Chief Complaint  Chief Complaint  Patient presents with  . Nasal Congestion    x3 days , pt has been taking OTC  medications to treat symptoms    Sore Throat   This is a new problem. The current episode started in the past 7 days (2 days ago). There has been no fever (elevated temperature). Associated symptoms include congestion, coughing, a hoarse voice and swollen glands. Pertinent negatives include no shortness of breath. She has tried nothing (Coricidin, Chloraspetic.) for the symptoms.    Past Medical History:  Diagnosis Date  . Diabetes mellitus without complication ([place])   . Hyperlipidemia   . Hypertension   . Meningioma ([place])   . Parkinson's disease ([place])    Followed by Neurology.    Past Surgical History:  Procedure Laterality Date  . ABDOMINAL HYSTERECTOMY    . APPENDECTOMY    . CHOLECYSTECTOMY    . SHOULDER SURGERY      Family History  Problem Relation Age of Onset  . Breast cancer Mother 70  . Heart disease Father   . Cancer Brother     Colon CA  . Kidney failure Brother   . Cancer Brother     Prostate CA  . Kidney failure Brother   . Kidney failure Sister   . Kidney failure Sister     Social History   Social History  . Marital status: Widowed    Spouse name: N/A  . Number of children: N/A  . Years of education: N/A   Occupational History  . Not on file.   Social History Main Topics  . Smoking status: Former Smoker    Types: Cigarettes  . Smokeless tobacco: Never Used     Comment: for a short time period  . Alcohol use No  . Drug use: No  . Sexual activity: No   Other Topics Concern  . Not on file   Social History Narrative  . No narrative on file     Current Outpatient Prescriptions:  .  aspirin 81 MG tablet, Take by mouth., Disp: , Rfl:  .  losartan (COZAAR) 50 MG tablet, TAKE 1 TABLET(50 MG) BY MOUTH DAILY, Disp: 90  tablet, Rfl: 0 .  metFORMIN (GLUCOPHAGE) 500 MG tablet, TAKE 1 TABLET(500 MG) BY MOUTH TWICE DAILY WITH A MEAL, Disp: 180 tablet, Rfl: 0 .  pravastatin (PRAVACHOL) 40 MG tablet, TAKE 1 TABLET BY MOUTH EVERY DAY, Disp: 30 tablet, Rfl: 2 .  sitaGLIPtin (JANUVIA) 50 MG tablet, TAKE 1 TABLET(50 MG) BY MOUTH DAILY, Disp: 90 tablet, Rfl: 0 .  carbidopa-levodopa (SINEMET IR) 25-100 MG tablet, Take 3 tablets by mouth 3 (three) times daily. , Disp: , Rfl:   Allergies  Allergen Reactions  . Codeine Other (See Comments)     Review of Systems  Constitutional: Negative for chills and fever.  HENT: Positive for congestion, hoarse voice and sore throat.   Respiratory: Positive for cough. Negative for shortness of breath.      Objective  Vitals:   01/10/17 0923  BP: (!) 151/73  Pulse: 98  Resp: 17  Temp: 99 F (37.2 C)  TempSrc: Oral  SpO2: 96%  Weight: 142 lb 3.2 oz (64.5 kg)  Height: 5' (1.524 m)    Physical Exam  Constitutional: She is well-developed, well-nourished, and in no distress.  HENT:  Head:  Normocephalic and atraumatic.  Left Ear: Tympanic membrane and ear canal normal.  Nose: Right sinus exhibits frontal sinus tenderness. Right sinus exhibits no maxillary sinus tenderness. Left sinus exhibits frontal sinus tenderness.  Mouth/Throat: Posterior oropharyngeal erythema present.  Left ear canal with cerumen impaction. Right ear canal normal, no erythema, draingae, TM normal appearance  Cardiovascular: Normal rate, regular rhythm, S1 normal and S2 normal.   No murmur heard. Pulmonary/Chest: Effort normal and breath sounds normal. No respiratory distress. She has no wheezes. She has no rhonchi.  Nursing note and vitals reviewed.     Assessment & Plan  1. URI with cough and congestion Point-of-care flu testing is negative, started on Augmentin for treatment of URI with benzonatate for relief of coughing - POCT Influenza A/B - amoxicillin-clavulanate (AUGMENTIN) 875-125  MG tablet; Take 1 tablet by mouth 2 (two) times daily.  Dispense: 20 tablet; Refill: 0 - benzonatate (TESSALON) 200 MG capsule; Take 1 capsule (200 mg total) by mouth 2 (two) times daily as needed for cough.  Dispense: 14 capsule; Refill: 0   Rayleen Wyrick Asad A. [facility] 01/10/2017 9:51 AM

## 2017-01-14 ENCOUNTER — Ambulatory Visit: Payer: Medicare Other | Admitting: Family Medicine

## 2017-02-07 ENCOUNTER — Encounter: Payer: Self-pay | Admitting: Family Medicine

## 2017-02-07 ENCOUNTER — Ambulatory Visit (INDEPENDENT_AMBULATORY_CARE_PROVIDER_SITE_OTHER): Payer: Medicare Other | Admitting: Family Medicine

## 2017-02-07 VITALS — BP 141/81 | HR 97 | Temp 98.6°F | Resp 17 | Ht 60.0 in | Wt 140.6 lb

## 2017-02-07 DIAGNOSIS — R000 Tachycardia, unspecified: Secondary | ICD-10-CM

## 2017-02-07 DIAGNOSIS — E1165 Type 2 diabetes mellitus with hyperglycemia: Secondary | ICD-10-CM

## 2017-02-07 DIAGNOSIS — I10 Essential (primary) hypertension: Secondary | ICD-10-CM | POA: Diagnosis not present

## 2017-02-07 DIAGNOSIS — IMO0001 Reserved for inherently not codable concepts without codable children: Secondary | ICD-10-CM

## 2017-02-07 DIAGNOSIS — E7800 Pure hypercholesterolemia, unspecified: Secondary | ICD-10-CM | POA: Diagnosis not present

## 2017-02-07 LAB — POCT GLYCOSYLATED HEMOGLOBIN (HGB A1C): Hemoglobin A1C: 7.8

## 2017-02-07 LAB — GLUCOSE, POCT (MANUAL RESULT ENTRY): POC Glucose: 203 mg/dl — AB (ref 70–99)

## 2017-02-07 MED ORDER — PRAVASTATIN SODIUM 40 MG PO TABS: 40.0000 mg | ORAL_TABLET | Freq: Every day | ORAL | 0 refills | Status: DC

## 2017-02-07 MED ORDER — METFORMIN HCL 500 MG PO TABS: ORAL_TABLET | ORAL | 0 refills | Status: DC

## 2017-02-07 MED ORDER — SITAGLIPTIN PHOSPHATE 100 MG PO TABS: 100.0000 mg | ORAL_TABLET | Freq: Every day | ORAL | 0 refills | Status: DC

## 2017-02-07 MED ORDER — LOSARTAN POTASSIUM 50 MG PO TABS: ORAL_TABLET | ORAL | 0 refills | Status: DC

## 2017-02-07 NOTE — Progress Notes (Signed)
Name: Cathy Barton   MRN: [id number]    DOB: 1930-03-09   Date:02/07/2017       Progress Note  Subjective  Chief Complaint  Chief Complaint  Patient presents with  . Follow-up    1 mo  . Medication Refill    Hypertension  This is a chronic problem. The problem is unchanged. The problem is controlled. Associated symptoms include palpitations (had some palpitations last month when she took antibiotics, resolved with discontinuation.). Pertinent negatives include no blurred vision, chest pain, headaches or shortness of breath. Past treatments include angiotensin blockers. There is no history of kidney disease, CAD/MI or CVA.  Diabetes  She presents for her follow-up diabetic visit. She has type 2 diabetes mellitus. Her disease course has been improving. Pertinent negatives for hypoglycemia include no headaches. Associated symptoms include foot paresthesias (occasional paresthesias in her feet). Pertinent negatives for diabetes include no blurred vision, no chest pain, no fatigue, no polydipsia, no polyuria and no visual change. Pertinent negatives for diabetic complications include no CVA, heart disease or peripheral neuropathy. Current diabetic treatment includes oral agent (dual therapy). She is following a diabetic diet. She monitors blood glucose at home 1-2 x per day. Her breakfast blood glucose range is generally 180-200 mg/dl. An ACE inhibitor/angiotensin II receptor blocker is being taken.  Hyperlipidemia  This is a chronic problem. The problem is controlled. Recent lipid tests were reviewed and are normal. Pertinent negatives include no chest pain, leg pain, myalgias or shortness of breath. Current antihyperlipidemic treatment includes statins.     Past Medical History:  Diagnosis Date  . Diabetes mellitus without complication ([place])   . Hyperlipidemia   . Hypertension   . Meningioma ([place])   . Parkinson's disease ([place])    Followed by Neurology.    Past Surgical History:   Procedure Laterality Date  . ABDOMINAL HYSTERECTOMY    . APPENDECTOMY    . CHOLECYSTECTOMY    . SHOULDER SURGERY      Family History  Problem Relation Age of Onset  . Breast cancer Mother 27  . Heart disease Father   . Cancer Brother     Colon CA  . Kidney failure Brother   . Cancer Brother     Prostate CA  . Kidney failure Brother   . Kidney failure Sister   . Kidney failure Sister     Social History   Social History  . Marital status: Widowed    Spouse name: N/A  . Number of children: N/A  . Years of education: N/A   Occupational History  . Not on file.   Social History Main Topics  . Smoking status: Former Smoker    Types: Cigarettes  . Smokeless tobacco: Never Used     Comment: for a short time period  . Alcohol use No  . Drug use: No  . Sexual activity: No   Other Topics Concern  . Not on file   Social History Narrative  . No narrative on file     Current Outpatient Prescriptions:  .  aspirin 81 MG tablet, Take by mouth., Disp: , Rfl:  .  losartan (COZAAR) 50 MG tablet, TAKE 1 TABLET(50 MG) BY MOUTH DAILY, Disp: 90 tablet, Rfl: 0 .  metFORMIN (GLUCOPHAGE) 500 MG tablet, TAKE 1 TABLET(500 MG) BY MOUTH TWICE DAILY WITH A MEAL, Disp: 180 tablet, Rfl: 0 .  pravastatin (PRAVACHOL) 40 MG tablet, TAKE 1 TABLET BY MOUTH EVERY DAY, Disp: 30 tablet, Rfl: 2 .  sitaGLIPtin (  JANUVIA) 50 MG tablet, TAKE 1 TABLET(50 MG) BY MOUTH DAILY, Disp: 90 tablet, Rfl: 0 .  amoxicillin-clavulanate (AUGMENTIN) 875-125 MG tablet, Take 1 tablet by mouth 2 (two) times daily. (Patient not taking: Reported on 02/07/2017), Disp: 20 tablet, Rfl: 0 .  carbidopa-levodopa (SINEMET IR) 25-100 MG tablet, Take 3 tablets by mouth 3 (three) times daily. , Disp: , Rfl:   Allergies  Allergen Reactions  . Codeine Other (See Comments)     Review of Systems  Constitutional: Negative for fatigue.  Eyes: Negative for blurred vision.  Respiratory: Negative for shortness of breath.    Cardiovascular: Positive for palpitations (had some palpitations last month when she took antibiotics, resolved with discontinuation.). Negative for chest pain.  Musculoskeletal: Negative for myalgias.  Neurological: Negative for headaches.  Endo/Heme/Allergies: Negative for polydipsia.    Objective  Vitals:   02/07/17 0942  BP: (!) 141/81  Pulse: 97  Resp: 17  Temp: 98.6 F (37 C)  TempSrc: Oral  SpO2: 95%  Weight: 140 lb 9.6 oz (63.8 kg)  Height: 5' (1.524 m)    Physical Exam  Constitutional: She is oriented to person, place, and time and well-developed, well-nourished, and in no distress.  HENT:  Head: Normocephalic and atraumatic.  Cardiovascular: Regular rhythm, S1 normal, S2 normal and normal heart sounds.  Tachycardia present.   No murmur heard. Pulmonary/Chest: Effort normal and breath sounds normal. She has no wheezes. She has no rhonchi.  Abdominal: Soft. Bowel sounds are normal. There is no tenderness.  Musculoskeletal: Normal range of motion. She exhibits no edema or tenderness.       Right ankle: She exhibits no swelling.       Left ankle: She exhibits no swelling.  Neurological: She is alert and oriented to person, place, and time. She displays tremor (resting tremor at 4-5 Hz in both UEs).  Psychiatric: Mood, memory, affect and judgment normal.  Nursing note and vitals reviewed.      Assessment & Plan  1. Uncontrolled type 2 diabetes mellitus without complication, without long-term current use of insulin (HCC) Hemoglobin A1c is marginally improved at 7.8%, increased Januvia to 100 mg daily, continue on metformin at same dosage - sitaGLIPtin (JANUVIA) 100 MG tablet; Take 1 tablet (100 mg total) by mouth daily.  Dispense: 90 tablet; Refill: 0 - metFORMIN (GLUCOPHAGE) 500 MG tablet; TAKE 1 TABLET(500 MG) BY MOUTH TWICE DAILY WITH A MEAL  Dispense: 180 tablet; Refill: 0  2. Essential hypertension BP stable but elevated above goal. Recheck in one month -  losartan (COZAAR) 50 MG tablet; TAKE 1 TABLET(50 MG) BY MOUTH DAILY  Dispense: 90 tablet; Refill: 0  3. Pure hypercholesterolemia  - Lipid panel - COMPLETE METABOLIC PANEL WITH GFR - pravastatin (PRAVACHOL) 40 MG tablet; Take 1 tablet (40 mg total) by mouth daily.  Dispense: 90 tablet; Refill: 0  4. Tachycardia EKG shows normal sinus rhythm, patient reassured if she has any recurrence of palpitations, will need referral to cardiology   Cathy Barton Asad A. [facility] 02/07/2017 10:10 AM

## 2017-05-10 ENCOUNTER — Other Ambulatory Visit: Payer: Self-pay | Admitting: Family Medicine

## 2017-05-10 DIAGNOSIS — E7800 Pure hypercholesterolemia, unspecified: Secondary | ICD-10-CM

## 2017-05-10 DIAGNOSIS — E1165 Type 2 diabetes mellitus with hyperglycemia: Secondary | ICD-10-CM

## 2017-05-10 DIAGNOSIS — IMO0001 Reserved for inherently not codable concepts without codable children: Secondary | ICD-10-CM

## 2017-05-10 DIAGNOSIS — I10 Essential (primary) hypertension: Secondary | ICD-10-CM

## 2017-07-21 ENCOUNTER — Other Ambulatory Visit: Payer: Self-pay | Admitting: Family Medicine

## 2017-08-08 ENCOUNTER — Other Ambulatory Visit: Payer: Self-pay | Admitting: Family Medicine

## 2017-08-08 DIAGNOSIS — E1165 Type 2 diabetes mellitus with hyperglycemia: Principal | ICD-10-CM

## 2017-08-08 DIAGNOSIS — IMO0001 Reserved for inherently not codable concepts without codable children: Secondary | ICD-10-CM

## 2017-08-11 ENCOUNTER — Other Ambulatory Visit: Payer: Self-pay | Admitting: Family Medicine

## 2017-08-11 DIAGNOSIS — I10 Essential (primary) hypertension: Secondary | ICD-10-CM

## 2017-08-13 ENCOUNTER — Telehealth: Payer: Self-pay

## 2017-08-13 DIAGNOSIS — IMO0001 Reserved for inherently not codable concepts without codable children: Secondary | ICD-10-CM

## 2017-08-13 DIAGNOSIS — E1165 Type 2 diabetes mellitus with hyperglycemia: Secondary | ICD-10-CM

## 2017-08-13 DIAGNOSIS — I10 Essential (primary) hypertension: Secondary | ICD-10-CM

## 2017-08-13 MED ORDER — SITAGLIPTIN PHOSPHATE 100 MG PO TABS: 100.0000 mg | ORAL_TABLET | Freq: Every day | ORAL | 0 refills | Status: DC

## 2017-08-13 MED ORDER — LOSARTAN POTASSIUM 50 MG PO TABS: ORAL_TABLET | ORAL | 0 refills | Status: DC

## 2017-08-13 NOTE — Telephone Encounter (Signed)
Medication has been refilled and sent to [facility]

## 2017-09-16 ENCOUNTER — Other Ambulatory Visit: Payer: Self-pay | Admitting: Family Medicine

## 2017-09-16 DIAGNOSIS — I10 Essential (primary) hypertension: Secondary | ICD-10-CM

## 2017-09-18 ENCOUNTER — Ambulatory Visit (INDEPENDENT_AMBULATORY_CARE_PROVIDER_SITE_OTHER): Payer: Medicare Other | Admitting: Family Medicine

## 2017-09-18 ENCOUNTER — Encounter: Payer: Self-pay | Admitting: Family Medicine

## 2017-09-18 VITALS — BP 116/64 | HR 91 | Temp 98.0°F | Resp 16 | Wt 140.0 lb

## 2017-09-18 DIAGNOSIS — R1013 Epigastric pain: Secondary | ICD-10-CM | POA: Diagnosis not present

## 2017-09-18 DIAGNOSIS — R9431 Abnormal electrocardiogram [ECG] [EKG]: Secondary | ICD-10-CM | POA: Diagnosis not present

## 2017-09-18 LAB — CBC WITH DIFFERENTIAL/PLATELET
BASOS ABS: 61 {cells}/uL (ref 0–200)
Basophils Relative: 0.6 %
EOS PCT: 1.1 %
Eosinophils Absolute: 111 cells/uL (ref 15–500)
HEMATOCRIT: 34.6 % — AB (ref 35.0–45.0)
HEMOGLOBIN: 11.3 g/dL — AB (ref 11.7–15.5)
LYMPHS ABS: 3091 {cells}/uL (ref 850–3900)
MCH: 27.5 pg (ref 27.0–33.0)
MCHC: 32.7 g/dL (ref 32.0–36.0)
MCV: 84.2 fL (ref 80.0–100.0)
MPV: 11.4 fL (ref 7.5–12.5)
Monocytes Relative: 8.4 %
NEUTROS ABS: 5989 {cells}/uL (ref 1500–7800)
Neutrophils Relative %: 59.3 %
Platelets: 250 10*3/uL (ref 140–400)
RBC: 4.11 10*6/uL (ref 3.80–5.10)
RDW: 13.9 % (ref 11.0–15.0)
Total Lymphocyte: 30.6 %
WBC: 10.1 10*3/uL (ref 3.8–10.8)
WBCMIX: 848 {cells}/uL (ref 200–950)

## 2017-09-18 LAB — COMPLETE METABOLIC PANEL WITH GFR
AG RATIO: 1.3 (calc) (ref 1.0–2.5)
ALBUMIN MSPROF: 4.1 g/dL (ref 3.6–5.1)
ALKALINE PHOSPHATASE (APISO): 105 U/L (ref 33–130)
ALT: 3 U/L — ABNORMAL LOW (ref 6–29)
AST: 13 U/L (ref 10–35)
BILIRUBIN TOTAL: 0.5 mg/dL (ref 0.2–1.2)
BUN / CREAT RATIO: 18 (calc) (ref 6–22)
BUN: 19 mg/dL (ref 7–25)
CALCIUM: 9.6 mg/dL (ref 8.6–10.4)
CO2: 26 mmol/L (ref 20–32)
Chloride: 101 mmol/L (ref 98–110)
Creat: 1.07 mg/dL — ABNORMAL HIGH (ref 0.60–0.88)
GFR, EST AFRICAN AMERICAN: 55 mL/min/{1.73_m2} — AB (ref >=60)
GFR, Est Non African American: 47 mL/min/{1.73_m2} — ABNORMAL LOW (ref >=60)
GLOBULIN: 3.2 g/dL (ref 1.9–3.7)
Glucose, Bld: 159 mg/dL — ABNORMAL HIGH (ref 65–139)
Potassium: 4 mmol/L (ref 3.5–5.3)
SODIUM: 134 mmol/L — AB (ref 135–146)
Total Protein: 7.3 g/dL (ref 6.1–8.1)

## 2017-09-18 LAB — LIPASE: Lipase: 19 U/L (ref 7–60)

## 2017-09-18 LAB — AMYLASE: AMYLASE: 694 U/L — AB (ref 21–101)

## 2017-09-18 MED ORDER — PANTOPRAZOLE SODIUM 20 MG PO TBEC: 20.0000 mg | DELAYED_RELEASE_TABLET | Freq: Every day | ORAL | 0 refills | Status: DC

## 2017-09-18 NOTE — Progress Notes (Signed)
Name: Cathy Barton   MRN: [id number]    DOB: 29-Jun-1930   Date:09/18/2017       Progress Note  Subjective  Chief Complaint  Chief Complaint  Patient presents with  . Abdominal Pain    comes and goes. Started 2 weeks ago. upper mid stoamch pain.     Abdominal Pain  This is a new problem. The current episode started 1 to 4 weeks ago (2 weeks ago). The onset quality is gradual. The problem occurs intermittently. The problem has been unchanged. The pain is located in the epigastric region. The pain is moderate. The quality of the pain is burning. Pertinent negatives include no belching, fever, flatus, hematochezia, hematuria, nausea or vomiting. Exacerbated by: comes on when she has not eaten and also when she takes her medications. The pain is relieved by eating. She has tried antacids and H2 blockers for the symptoms. The treatment provided moderate relief.      Past Medical History:  Diagnosis Date  . Diabetes mellitus without complication ([place])   . Hyperlipidemia   . Hypertension   . Meningioma ([place])   . Parkinson's disease ([place])    Followed by Neurology.    Past Surgical History:  Procedure Laterality Date  . ABDOMINAL HYSTERECTOMY    . APPENDECTOMY    . CHOLECYSTECTOMY    . SHOULDER SURGERY      Family History  Problem Relation Age of Onset  . Breast cancer Mother 44  . Heart disease Father   . Cancer Brother        Colon CA  . Kidney failure Brother   . Cancer Brother        Prostate CA  . Kidney failure Brother   . Kidney failure Sister   . Kidney failure Sister     Social History   Social History  . Marital status: Widowed    Spouse name: N/A  . Number of children: N/A  . Years of education: N/A   Occupational History  . Not on file.   Social History Main Topics  . Smoking status: Former Smoker    Types: Cigarettes  . Smokeless tobacco: Never Used     Comment: for a short time period  . Alcohol use No  . Drug use: No  . Sexual activity: No    Other Topics Concern  . Not on file   Social History Narrative  . No narrative on file     Current Outpatient Prescriptions:  .  aspirin 81 MG tablet, Take by mouth., Disp: , Rfl:  .  carbidopa-levodopa (SINEMET IR) 25-100 MG tablet, Take 2 tablets by mouth 3 (three) times daily., Disp: , Rfl: 1 .  ibuprofen (ADVIL,MOTRIN) 200 MG tablet, Take 1 tablet by mouth every 6 (six) hours as needed., Disp: , Rfl:  .  losartan (COZAAR) 50 MG tablet, TAKE 1 TABLET(50 MG) BY MOUTH DAILY, Disp: 30 tablet, Rfl: 0 .  metFORMIN (GLUCOPHAGE) 500 MG tablet, TAKE 1 TABLET(500 MG) BY MOUTH TWICE DAILY WITH A MEAL, Disp: 180 tablet, Rfl: 0 .  pravastatin (PRAVACHOL) 40 MG tablet, Take 1 tablet (40 mg total) by mouth daily., Disp: 90 tablet, Rfl: 0 .  ranitidine (ZANTAC) 150 MG capsule, Take 1 capsule by mouth daily as needed., Disp: , Rfl:  .  sitaGLIPtin (JANUVIA) 100 MG tablet, Take 1 tablet (100 mg total) by mouth daily., Disp: 30 tablet, Rfl: 0 .  carbidopa-levodopa (SINEMET IR) 25-100 MG tablet, Take 3 tablets by mouth 3 (  three) times daily. , Disp: , Rfl:   Allergies  Allergen Reactions  . Codeine Other (See Comments)     Review of Systems  Constitutional: Negative for fever.  Gastrointestinal: Positive for abdominal pain. Negative for flatus, hematochezia, nausea and vomiting.  Genitourinary: Negative for hematuria.      Objective  Vitals:   09/18/17 1417  BP: 116/64  Pulse: 91  Resp: 16  Temp: 98 F (36.7 C)  TempSrc: Oral  SpO2: 95%  Weight: 140 lb (63.5 kg)    Physical Exam  Constitutional: She is oriented to person, place, and time and well-developed, well-nourished, and in no distress.  Cardiovascular: Normal rate, regular rhythm and normal heart sounds.   No murmur heard. Pulmonary/Chest: Effort normal and breath sounds normal.  Abdominal: Soft. Bowel sounds are normal. There is tenderness in the epigastric area. There is no rebound.  Neurological: She is alert and  oriented to person, place, and time.  Skin: Skin is dry.  Psychiatric: Mood, memory, affect and judgment normal.  Nursing note and vitals reviewed.     Assessment & Plan  1. Epigastric pain Suspect gastritis vs gastric ulcer vs other intraabdominal pathology, Obtain pertinent work up, start on PPI - CBC with Differential/Platelet - COMPLETE METABOLIC PANEL WITH GFR - Amylase - Lipase - EKG 12-Lead - US Abdomen Complete; Future - pantoprazole (PROTONIX) 20 MG tablet; Take 1 tablet (20 mg total) by mouth daily.  Dispense: 30 tablet; Refill: 0  2. Abnormal finding on EKG EKG shows changes in V2, referral to cardiology for follow up - Ambulatory referral to Cardiology  [facility] 09/18/2017 2:41 PM

## 2017-09-19 ENCOUNTER — Emergency Department
Admission: EM | Admit: 2017-09-19 | Discharge: 2017-09-19 | Disposition: A | Discharge status: Home / Self Care | Payer: Medicare Other | Attending: Emergency Medicine | Admitting: Emergency Medicine

## 2017-09-19 ENCOUNTER — Telehealth: Payer: Self-pay

## 2017-09-19 ENCOUNTER — Encounter: Payer: Self-pay | Admitting: Emergency Medicine

## 2017-09-19 DIAGNOSIS — E119 Type 2 diabetes mellitus without complications: Secondary | ICD-10-CM | POA: Insufficient documentation

## 2017-09-19 DIAGNOSIS — I10 Essential (primary) hypertension: Secondary | ICD-10-CM | POA: Insufficient documentation

## 2017-09-19 DIAGNOSIS — G20 Parkinson's disease: Secondary | ICD-10-CM | POA: Diagnosis not present

## 2017-09-19 DIAGNOSIS — R748 Abnormal levels of other serum enzymes: Secondary | ICD-10-CM | POA: Insufficient documentation

## 2017-09-19 DIAGNOSIS — Z008 Encounter for other general examination: Secondary | ICD-10-CM | POA: Diagnosis present

## 2017-09-19 DIAGNOSIS — Z87891 Personal history of nicotine dependence: Secondary | ICD-10-CM | POA: Diagnosis not present

## 2017-09-19 DIAGNOSIS — E785 Hyperlipidemia, unspecified: Secondary | ICD-10-CM | POA: Insufficient documentation

## 2017-09-19 DIAGNOSIS — Z79899 Other long term (current) drug therapy: Secondary | ICD-10-CM | POA: Insufficient documentation

## 2017-09-19 DIAGNOSIS — Z7984 Long term (current) use of oral hypoglycemic drugs: Secondary | ICD-10-CM | POA: Insufficient documentation

## 2017-09-19 LAB — COMPREHENSIVE METABOLIC PANEL
ALBUMIN: 4.2 g/dL (ref 3.5–5.0)
ALK PHOS: 102 U/L (ref 38–126)
ALT: 10 U/L — AB (ref 14–54)
AST: 21 U/L (ref 15–41)
Anion gap: 9 (ref 5–15)
BUN: 20 mg/dL (ref 6–20)
CALCIUM: 10.2 mg/dL (ref 8.9–10.3)
CHLORIDE: 103 mmol/L (ref 101–111)
CO2: 25 mmol/L (ref 22–32)
CREATININE: 0.88 mg/dL (ref 0.44–1.00)
GFR calc Af Amer: >60 mL/min (ref >60)
GFR calc non Af Amer: 58 mL/min — ABNORMAL LOW (ref >60)
Glucose, Bld: 195 mg/dL — ABNORMAL HIGH (ref 65–99)
Potassium: 3.9 mmol/L (ref 3.5–5.1)
SODIUM: 137 mmol/L (ref 135–145)
Total Bilirubin: 0.7 mg/dL (ref 0.3–1.2)
Total Protein: 8.2 g/dL — ABNORMAL HIGH (ref 6.5–8.1)

## 2017-09-19 LAB — URINALYSIS, COMPLETE (UACMP) WITH MICROSCOPIC
BILIRUBIN URINE: NEGATIVE
Glucose, UA: 50 mg/dL — AB
KETONES UR: NEGATIVE mg/dL
Nitrite: NEGATIVE
PH: 5 (ref 5.0–8.0)
Protein, ur: 100 mg/dL — AB
SPECIFIC GRAVITY, URINE: 1.009 (ref 1.005–1.030)

## 2017-09-19 LAB — CBC
HCT: 36 % (ref 35.0–47.0)
Hemoglobin: 12.4 g/dL (ref 12.0–16.0)
MCH: 28 pg (ref 26.0–34.0)
MCHC: 34.4 g/dL (ref 32.0–36.0)
MCV: 81.4 fL (ref 80.0–100.0)
PLATELETS: 246 10*3/uL (ref 150–440)
RBC: 4.43 MIL/uL (ref 3.80–5.20)
RDW: 14.7 % — ABNORMAL HIGH (ref 11.5–14.5)
WBC: 7.7 10*3/uL (ref 3.6–11.0)

## 2017-09-19 LAB — AMYLASE: AMYLASE: 554 U/L — AB (ref 28–100)

## 2017-09-19 LAB — LIPASE, BLOOD: LIPASE: 27 U/L (ref 11–51)

## 2017-09-19 NOTE — ED Notes (Signed)
Lab states that they are able to add urine culture to specimen in lab.

## 2017-09-19 NOTE — ED Notes (Signed)
Pt denies any abd pain at this time and advised that the doctor gave her acid reflux med's yesterday and it has helped with her symptoms.

## 2017-09-19 NOTE — Telephone Encounter (Signed)
Dr. Manuella Ghazi and I was not able to reach the pt regarding her lab results. Dr, Manuella Ghazi would like for the Pt to proceed to the ER immediatly for evaluation of possible pancreatitis and to confirm with imaging. I also called the pt son and was not abel to leave a voicemail due to not having one set up.

## 2017-09-19 NOTE — ED Triage Notes (Signed)
Pt sent over from PCP for possible pancreatitis.

## 2017-09-19 NOTE — ED Notes (Signed)
Pt c/o epigastric pain that was present yesterday. Had blood work done at PCP and instructed to come to ER for further evaluation today.  Pt reports epigastric "discomfort" this AM but denies at this time.

## 2017-09-19 NOTE — Telephone Encounter (Signed)
Pt notified and will have her son take her to the hospital to be further evaluated

## 2017-09-19 NOTE — ED Notes (Signed)
Pt having meal at this time.

## 2017-09-19 NOTE — Discharge Instructions (Signed)
follow closely with primary care and GIreturn to the emergency for further chest pain shortness of breath nausea vomiting abdominal pain bleeding black stool or other concerns.

## 2017-09-19 NOTE — ED Provider Notes (Addendum)
[facility]  ____________________________________________   I have reviewed the triage vital signs and the nursing notes.   HISTORY  Chief Complaint Abdominal Pain    HPI Cathy Barton is a 83 y.o. female Who presents today complaining of nothing. Patient was sent in here by her doctor. Patient does state that the last 2 weeks she's had an epigastric abdominal discomfort after she takes her Parkinson pills that last for about 20 minutes. Usually takes a few minutes to, but usually is associated with cell. No vomiting no fever no chills no chest pain or shortness of breath no exertional symptoms no diarrhea, no hematemesis no melena bright red blood per rectum no other aggravating or alleviating factors, no pain today. Patient does have a history of reflux disease. Her doctor gave her antiacids and she's been completely pain free today. However, despite a negative lipase patient had a positive amylase and she was sent in here for possible pancreatitis. Patient is not complaining of any vomiting or pain or weight loss. She would like to go home. She had a prolonged wait in the waiting room unfortunately and she is ready to go.     Past Medical History:  Diagnosis Date  . Diabetes mellitus without complication ([place])   . Hyperlipidemia   . Hypertension   . Meningioma ([place])   . Parkinson's disease ([place])    Followed by Neurology.    Patient Active Problem List   Diagnosis Date Noted  . Abnormal finding on EKG 09/18/2017  . URI with cough and congestion 01/10/2017  . Uncontrolled diabetes mellitus without complication ([place]) 44/12/270  . Hypertension 03/06/2016  . Parkinson's disease ([place]) 03/06/2016  . Hyperlipidemia 03/06/2016    Past Surgical History:  Procedure Laterality Date  . ABDOMINAL HYSTERECTOMY    . APPENDECTOMY    . CHOLECYSTECTOMY    . SHOULDER SURGERY      Prior to Admission medications    Medication Sig Start Date End Date Taking? Authorizing Provider  aspirin 81 MG tablet Take by mouth.    [provider]  carbidopa-levodopa (SINEMET IR) 25-100 MG tablet Take 3 tablets by mouth 3 (three) times daily.  09/28/15 06/24/16  [provider]  carbidopa-levodopa (SINEMET IR) 25-100 MG tablet Take 2 tablets by mouth 3 (three) times daily. 09/10/17   [provider]  ibuprofen (ADVIL,MOTRIN) 200 MG tablet Take 1 tablet by mouth every 6 (six) hours as needed.    [provider]  losartan (COZAAR) 50 MG tablet TAKE 1 TABLET(50 MG) BY MOUTH DAILY 08/13/17   Rochel Brome A, MD  metFORMIN (GLUCOPHAGE) 500 MG tablet TAKE 1 TABLET(500 MG) BY MOUTH TWICE DAILY WITH A MEAL 02/07/17   Roselee Nova, MD  pantoprazole (PROTONIX) 20 MG tablet Take 1 tablet (20 mg total) by mouth daily. 09/18/17 10/18/17  Roselee Nova, MD  pravastatin (PRAVACHOL) 40 MG tablet Take 1 tablet (40 mg total) by mouth daily. 02/07/17   Roselee Nova, MD  ranitidine (ZANTAC) 150 MG capsule Take 1 capsule by mouth daily as needed.    [provider]  sitaGLIPtin (JANUVIA) 100 MG tablet Take 1 tablet (100 mg total) by mouth daily. 08/13/17   Roselee Nova, MD    Allergies Codeine  Family History  Problem Relation Age of Onset  . Breast cancer Mother 16  . Heart disease Father   . Cancer Brother  Colon CA  . Kidney failure Brother   . Cancer Brother        Prostate CA  . Kidney failure Brother   . Kidney failure Sister   . Kidney failure Sister     Social History Social History  Substance Use Topics  . Smoking status: Former Smoker    Types: Cigarettes  . Smokeless tobacco: Never Used     Comment: for a short time period  . Alcohol use No    Review of Systems Constitutional: No fever/chills Eyes: No visual changes. ENT: No sore throat. No stiff neck no neck pain Cardiovascular: Denies chest pain. Respiratory: Denies shortness of  breath. Gastrointestinal:   no vomiting.  No diarrhea.  No constipation. Genitourinary: Negative for dysuria. Musculoskeletal: Negative lower extremity swelling Skin: Negative for rash. Neurological: Negative for severe headaches, focal weakness or numbness.   ____________________________________________   PHYSICAL EXAM:  VITAL SIGNS: ED Triage Vitals [09/19/17 1235]  Enc Vitals Group     BP (!) 184/91     Pulse Rate (!) 101     Resp 16     Temp 98 F (36.7 C)     Temp Source Oral     SpO2 100 %     Weight 140 lb (63.5 kg)     Height 5\' 2"  (1.575 m)     Head Circumference      Peak Flow      Pain Score      Pain Loc      Pain Edu?      Excl. in [place]?     Constitutional: Alert and oriented. Well appearing and in no acute distress. Eyes: Conjunctivae are normal Head: Atraumatic HEENT: No congestion/rhinnorhea. Mucous membranes are moist.  Oropharynx non-erythematous Neck:   Nontender with no meningismus, no masses, no stridor Cardiovascular: Normal rate, regular rhythm. Grossly normal heart sounds.  Good peripheral circulation. Respiratory: Normal respiratory effort.  No retractions. Lungs CTAB. Abdominal: Soft and nontender. No distention. No guarding no rebound Back:  There is no focal tenderness or step off.  there is no midline tenderness there are no lesions noted. there is no CVA tenderness Musculoskeletal: No lower extremity tenderness, no upper extremity tenderness. No joint effusions, no DVT signs strong distal pulses no edema Neurologic:  Normal speech and language. No gross focal neurologic deficits are appreciated.  Skin:  Skin is warm, dry and intact. No rash noted. Psychiatric: Mood and affect are normal. Speech and behavior are normal.  ____________________________________________   LABS (all labs ordered are listed, but only abnormal results are displayed)  Labs Reviewed  COMPREHENSIVE METABOLIC PANEL - Abnormal; Notable for the following:        Result Value   Glucose, Bld 195 (*)    Total Protein 8.2 (*)    ALT 10 (*)    GFR calc non Af Amer 58 (*)    All other components within normal limits  CBC - Abnormal; Notable for the following:    RDW 14.7 (*)    All other components within normal limits  URINALYSIS, COMPLETE (UACMP) WITH MICROSCOPIC - Abnormal; Notable for the following:    Color, Urine YELLOW (*)    APPearance CLOUDY (*)    Glucose, UA 50 (*)    Hgb urine dipstick SMALL (*)    Protein, ur 100 (*)    Leukocytes, UA MODERATE (*)    Bacteria, UA RARE (*)    Squamous Epithelial / LPF 6-30 (*)    All  other components within normal limits  URINE CULTURE  LIPASE, BLOOD  AMYLASE    Pertinent labs  results that were available during my care of the patient were reviewed by me and considered in my medical decision making (see chart for details). ____________________________________________  EKG  I personally interpreted any EKGs ordered by me or triage  ____________________________________________  RADIOLOGY  Pertinent labs & imaging results that were available during my care of the patient were reviewed by me and considered in my medical decision making (see chart for details). If possible, patient and/or family made aware of any abnormal findings. ____________________________________________    PROCEDURES  Procedure(s) performed: None  Procedures  Critical Care performed: None  ____________________________________________   INITIAL IMPRESSION / ASSESSMENT AND PLAN / ED COURSE  Pertinent labs & imaging results that were available during my care of the patient were reviewed by me and considered in my medical decision making (see chart for details).  she would no symptoms or complaints here today because of an elevated amylase at her primary care doctor's office. Unclear the significance of that. Certainly low suspicion for pancreatic tetanus given lack of pain, as well as a negative lipase 2. This is a  very nonspecific finding multiple different possible causes. This most likely could benefit from outpatient workup. There is no GI doctor on call until 7 patient would prefer not to wait we will see if she can eat without discomfort. Very low suspicion for ACS or PE or dissection given that she has food related abdominal pain which is better reflux medication. Patient has no exertional symptoms. She is only here because of a lab finding which I don't think is going to mandate imaging at this moment.  ----------------------------------------- 7:13 PM on 09/19/2017 -----------------------------------------  Pt in nad able to eat with no problem no symptoms at all since she has been here, I did discuss with Dr. Marius Ditch states that it would be of utility of the patient to get a CT scan on the emergency room but is not emergently back to be done as an outpatient. She is otherwise happy to see the patient as an outpatient. Patient herself very much would prefer not to stay for any further testing despite having a being offered to her. She is going to a community protection of sister act and she is adamant that she would like to be discharged at this time. Extensive return precautions and follow-up given and understood. At this time, there does not appear to be clinical evidence to support the diagnosis of pulmonary embolus, dissection, myocarditis, endocarditis, pericarditis, pericardial tamponade, acute coronary syndrome, pneumothorax, pneumonia, or any other acute intrathoracic pathology that will require admission or acute intervention. Nor is there evidence of any significant intra-abdominal pathology causing this discomfort.Considering the patient's symptoms, medical history, and physical examination today, I have low suspicion for cholecystitis or biliary pathology, pancreatitis, perforation or bowel obstruction, hernia, intra-abdominal abscess, AAA or dissection, volvulus or intussusception, mesenteric  ischemia, ischemic gut, pyelonephritis or appendicitis.   ____________________________________________   FINAL CLINICAL IMPRESSION(S) / ED DIAGNOSES  Final diagnoses:  None      This chart was dictated using voice recognition software.  Despite best efforts to proofread,  errors can occur which can change meaning.      Schuyler Amor, MD 09/19/17 Vern[facility] Emerald    Schuyler Amor, MD 09/19/17 [phone]

## 2017-09-19 NOTE — Telephone Encounter (Signed)
-----   Message from Roselee Nova, MD sent at 09/19/2017 10:45 AM EDT ----- CBC: Below normal hemoglobin and hematocrit at 11.3 and 34.6% respectively, normal white count and platelets CMP: Elevated glucose at 159 mg per DL, elevated creatinine at 1.07 with a GFR of 55, serum sodium is below normal at 134, otherwise normal electrolytes and liver enzymes. Amylase is elevated at 694, please advise patient to proceed to the ER immediately for evaluation of possible pancreatitis and to confirm with imaging versus rule out other diagnoses Lipase levels are within normal range

## 2017-09-21 LAB — URINE CULTURE

## 2017-09-23 ENCOUNTER — Ambulatory Visit
Admission: RE | Admit: 2017-09-23 | Discharge: 2017-09-23 | Disposition: A | Discharge status: Home / Self Care | Payer: Medicare Other | Source: Ambulatory Visit | Attending: Family Medicine | Admitting: Family Medicine

## 2017-09-23 ENCOUNTER — Ambulatory Visit: Payer: Medicare Other | Admitting: Family Medicine

## 2017-09-23 DIAGNOSIS — R1013 Epigastric pain: Secondary | ICD-10-CM | POA: Insufficient documentation

## 2017-09-24 ENCOUNTER — Ambulatory Visit: Payer: Medicare Other | Admitting: Family Medicine

## 2017-09-29 ENCOUNTER — Telehealth: Payer: Self-pay | Admitting: Family Medicine

## 2017-09-29 NOTE — Telephone Encounter (Signed)
Copied from [place] #[id number]. Topic: General - Other >> Sep 26, 2017  4:37 PM Patrice Paradise wrote: Reason for CRM: Pt called and stated that Etta Grandchild had called her but did not leave a msg. Pt is requesting a call back.

## 2017-09-30 ENCOUNTER — Ambulatory Visit: Payer: Medicare Other | Admitting: Family Medicine

## 2017-10-02 ENCOUNTER — Ambulatory Visit (INDEPENDENT_AMBULATORY_CARE_PROVIDER_SITE_OTHER): Payer: Medicare Other | Admitting: Family Medicine

## 2017-10-02 ENCOUNTER — Encounter: Payer: Self-pay | Admitting: Family Medicine

## 2017-10-02 VITALS — BP 144/100 | HR 100 | Temp 97.6°F | Resp 18 | Ht 62.0 in | Wt 137.2 lb

## 2017-10-02 DIAGNOSIS — R748 Abnormal levels of other serum enzymes: Secondary | ICD-10-CM | POA: Diagnosis not present

## 2017-10-02 DIAGNOSIS — I10 Essential (primary) hypertension: Secondary | ICD-10-CM | POA: Diagnosis not present

## 2017-10-02 DIAGNOSIS — R1013 Epigastric pain: Secondary | ICD-10-CM

## 2017-10-02 LAB — AMYLASE: AMYLASE: 153 U/L — AB (ref 21–101)

## 2017-10-02 MED ORDER — LOSARTAN POTASSIUM 50 MG PO TABS: ORAL_TABLET | ORAL | 0 refills | Status: DC

## 2017-10-02 MED ORDER — PANTOPRAZOLE SODIUM 20 MG PO TBEC: 20.0000 mg | DELAYED_RELEASE_TABLET | Freq: Every day | ORAL | 0 refills | Status: DC

## 2017-10-02 NOTE — Progress Notes (Signed)
Name: Cathy Barton   MRN: [id number]    DOB: 1930/05/31   Date:10/02/2017       Progress Note  Subjective  Chief Complaint  Chief Complaint  Patient presents with  . Follow-up    2 week F/U, Has seen Cardiology and has a Stress Test scheduled in a week  . Abdominal Pain    Last visit started Protonix and controlling stomach issues so far  . Hypertension    Needs refill of Losartan, Having headaches frequently    Abdominal Pain  This is a new problem. The problem has been resolved. The pain is located in the epigastric region. The pain is at a severity of 0/10. The patient is experiencing no pain. Associated symptoms include headaches (occasional headache). She has tried proton pump inhibitors for the symptoms. Prior diagnostic workup includes CT scan.  Hypertension  This is a chronic problem. The problem is unchanged. The problem is uncontrolled. Associated symptoms include headaches (occasional headache). Pertinent negatives include no blurred vision, chest pain, palpitations or shortness of breath. Past treatments include angiotensin blockers. Compliance problems: has not taken Losartan for more than a week.  There is no history of kidney disease, CAD/MI or CVA.     Past Medical History:  Diagnosis Date  . Diabetes mellitus without complication ([place])   . Hyperlipidemia   . Hypertension   . Meningioma ([place])   . Parkinson's disease ([place])    Followed by Neurology.    Past Surgical History:  Procedure Laterality Date  . ABDOMINAL HYSTERECTOMY    . APPENDECTOMY    . CHOLECYSTECTOMY    . SHOULDER SURGERY      Family History  Problem Relation Age of Onset  . Breast cancer Mother 38  . Heart disease Father   . Cancer Brother        Colon CA  . Kidney failure Brother   . Cancer Brother        Prostate CA  . Kidney failure Brother   . Kidney failure Sister   . Kidney failure Sister     Social History   Social History  . Marital status: Widowed    Spouse name: N/A   . Number of children: N/A  . Years of education: N/A   Occupational History  . Not on file.   Social History Main Topics  . Smoking status: Former Smoker    Types: Cigarettes  . Smokeless tobacco: Never Used     Comment: for a short time period  . Alcohol use No  . Drug use: No  . Sexual activity: No   Other Topics Concern  . Not on file   Social History Narrative  . No narrative on file     Current Outpatient Prescriptions:  .  aspirin 81 MG tablet, Take 81 mg by mouth daily. , Disp: , Rfl:  .  carbidopa-levodopa (SINEMET IR) 25-100 MG tablet, Take 2 tablets by mouth 3 (three) times daily., Disp: , Rfl: 1 .  ibuprofen (ADVIL,MOTRIN) 200 MG tablet, Take 1 tablet by mouth every 6 (six) hours as needed., Disp: , Rfl:  .  losartan (COZAAR) 50 MG tablet, TAKE 1 TABLET(50 MG) BY MOUTH DAILY, Disp: 30 tablet, Rfl: 0 .  metFORMIN (GLUCOPHAGE) 500 MG tablet, TAKE 1 TABLET(500 MG) BY MOUTH TWICE DAILY WITH A MEAL (Patient taking differently: Take 500 mg by mouth daily with breakfast. ), Disp: 180 tablet, Rfl: 0 .  pantoprazole (PROTONIX) 20 MG tablet, Take 1 tablet (20 mg  total) by mouth daily., Disp: 30 tablet, Rfl: 0 .  pravastatin (PRAVACHOL) 40 MG tablet, Take 1 tablet (40 mg total) by mouth daily., Disp: 90 tablet, Rfl: 0 .  ranitidine (ZANTAC) 150 MG capsule, Take 1 capsule by mouth daily as needed., Disp: , Rfl:  .  sitaGLIPtin (JANUVIA) 100 MG tablet, Take 1 tablet (100 mg total) by mouth daily., Disp: 30 tablet, Rfl: 0  Allergies  Allergen Reactions  . Codeine Other (See Comments)     Review of Systems  Eyes: Negative for blurred vision.  Respiratory: Negative for shortness of breath.   Cardiovascular: Negative for chest pain and palpitations.  Gastrointestinal: Positive for abdominal pain.  Neurological: Positive for headaches (occasional headache).    Objective  Vitals:   10/02/17 1204  BP: (!) 144/100  Pulse: 100  Resp: 18  Temp: 97.6 F (36.4 C)   TempSrc: Oral  SpO2: 98%  Weight: 137 lb 3.2 oz (62.2 kg)  Height: _0  (1.575 m)    Physical Exam  Constitutional: She is well-developed, well-nourished, and in no distress.  Cardiovascular: Normal rate, regular rhythm and normal heart sounds.   No murmur heard. Pulmonary/Chest: Effort normal and breath sounds normal. She has no wheezes.  Abdominal: Soft. Bowel sounds are normal. There is tenderness in the epigastric area.  Musculoskeletal:       Right ankle: She exhibits no swelling.       Left ankle: She exhibits no swelling.  Psychiatric: Mood, memory, affect and judgment normal.  Nursing note and vitals reviewed.     Recent Results (from the past 2160 hour(s))  CBC with Differential/Platelet     Status: Abnormal   Collection Time: 09/18/17  3:25 PM  Result Value Ref Range   WBC 10.1 3.8 - 10.8 Thousand/uL   RBC 4.11 3.80 - 5.10 Million/uL   Hemoglobin 11.3 (L) 11.7 - 15.5 g/dL   HCT 34.6 (L) 35.0 - 45.0 %   MCV 84.2 80.0 - 100.0 fL   MCH 27.5 27.0 - 33.0 pg   MCHC 32.7 32.0 - 36.0 g/dL   RDW 13.9 11.0 - 15.0 %   Platelets 250 140 - 400 Thousand/uL   MPV 11.4 7.5 - 12.5 fL   Neutro Abs 5,989 1,500 - 7,800 cells/uL   Lymphs Abs 3,091 850 - 3,900 cells/uL   WBC mixed population 848 200 - 950 cells/uL   Eosinophils Absolute 111 15 - 500 cells/uL   Basophils Absolute 61 0 - 200 cells/uL   Neutrophils Relative % 59.3 %   Total Lymphocyte 30.6 %   Monocytes Relative 8.4 %   Eosinophils Relative 1.1 %   Basophils Relative 0.6 %  COMPLETE METABOLIC PANEL WITH GFR     Status: Abnormal   Collection Time: 09/18/17  3:25 PM  Result Value Ref Range   Glucose, Bld 159 (H) 65 - 139 mg/dL    Comment: .        Non-fasting reference interval .    BUN 19 7 - 25 mg/dL   Creat 1.07 (H) 0.60 - 0.88 mg/dL    Comment: For patients >59 years of age, the reference limit for Creatinine is approximately 13% higher for people identified as African-American. .    GFR, Est Non  African American 47 (L) > OR = 60 mL/min/1.17m   GFR, Est African American 55 (L) > OR = 60 mL/min/1.734m  BUN/Creatinine Ratio 18 6 - 22 (calc)   Sodium 134 (L) 135 - 146 mmol/L  Potassium 4.0 3.5 - 5.3 mmol/L   Chloride 101 98 - 110 mmol/L   CO2 26 20 - 32 mmol/L   Calcium 9.6 8.6 - 10.4 mg/dL   Total Protein 7.3 6.1 - 8.1 g/dL   Albumin 4.1 3.6 - 5.1 g/dL   Globulin 3.2 1.9 - 3.7 g/dL (calc)   AG Ratio 1.3 1.0 - 2.5 (calc)   Total Bilirubin 0.5 0.2 - 1.2 mg/dL   Alkaline phosphatase (APISO) 105 33 - 130 U/L   AST 13 10 - 35 U/L   ALT 3 (L) 6 - 29 U/L  Amylase     Status: Abnormal   Collection Time: 09/18/17  3:25 PM  Result Value Ref Range   Amylase 694 (H) 21 - 101 U/L    Comment: Verified by repeat analysis. .   Lipase     Status: None   Collection Time: 09/18/17  3:25 PM  Result Value Ref Range   Lipase 19 7 - 60 U/L  Lipase, blood     Status: None   Collection Time: 09/19/17 12:40 PM  Result Value Ref Range   Lipase 27 11 - 51 U/L  Comprehensive metabolic panel     Status: Abnormal   Collection Time: 09/19/17 12:40 PM  Result Value Ref Range   Sodium 137 135 - 145 mmol/L   Potassium 3.9 3.5 - 5.1 mmol/L   Chloride 103 101 - 111 mmol/L   CO2 25 22 - 32 mmol/L   Glucose, Bld 195 (H) 65 - 99 mg/dL   BUN 20 6 - 20 mg/dL   Creatinine, Ser 0.88 0.44 - 1.00 mg/dL   Calcium 10.2 8.9 - 10.3 mg/dL   Total Protein 8.2 (H) 6.5 - 8.1 g/dL   Albumin 4.2 3.5 - 5.0 g/dL   AST 21 15 - 41 U/L   ALT 10 (L) 14 - 54 U/L   Alkaline Phosphatase 102 38 - 126 U/L   Total Bilirubin 0.7 0.3 - 1.2 mg/dL   GFR calc non Af Amer 58 (L) >60 mL/min   GFR calc Af Amer >60 >60 mL/min    Comment: (NOTE) The eGFR has been calculated using the CKD EPI equation. This calculation has not been validated in all clinical situations. eGFR's persistently <60 mL/min signify possible Chronic Kidney Disease.    Anion gap 9 5 - 15  CBC     Status: Abnormal   Collection Time: 09/19/17 12:40 PM   Result Value Ref Range   WBC 7.7 3.6 - 11.0 K/uL   RBC 4.43 3.80 - 5.20 MIL/uL   Hemoglobin 12.4 12.0 - 16.0 g/dL   HCT 36.0 35.0 - 47.0 %   MCV 81.4 80.0 - 100.0 fL   MCH 28.0 26.0 - 34.0 pg   MCHC 34.4 32.0 - 36.0 g/dL   RDW 14.7 (H) 11.5 - 14.5 %   Platelets 246 150 - 440 K/uL  Urinalysis, Complete w Microscopic     Status: Abnormal   Collection Time: 09/19/17 12:40 PM  Result Value Ref Range   Color, Urine YELLOW (A) YELLOW   APPearance CLOUDY (A) CLEAR   Specific Gravity, Urine 1.009 1.005 - 1.030   pH 5.0 5.0 - 8.0   Glucose, UA 50 (A) NEGATIVE mg/dL   Hgb urine dipstick SMALL (A) NEGATIVE   Bilirubin Urine NEGATIVE NEGATIVE   Ketones, ur NEGATIVE NEGATIVE mg/dL   Protein, ur 100 (A) NEGATIVE mg/dL   Nitrite NEGATIVE NEGATIVE   Leukocytes, UA MODERATE (A) NEGATIVE  RBC / HPF 0-5 0 - 5 RBC/hpf   WBC, UA 6-30 0 - 5 WBC/hpf   Bacteria, UA RARE (A) NONE SEEN   Squamous Epithelial / LPF 6-30 (A) NONE SEEN   Mucus PRESENT    Hyaline Casts, UA PRESENT   Amylase     Status: Abnormal   Collection Time: 09/19/17 12:40 PM  Result Value Ref Range   Amylase 554 (H) 28 - 100 U/L  Urine culture     Status: Abnormal   Collection Time: 09/19/17 12:40 PM  Result Value Ref Range   Specimen Description URINE, RANDOM    Special Requests NONE    Culture MULTIPLE SPECIES PRESENT, SUGGEST RECOLLECTION (A)    Report Status 09/21/2017 FINAL      Assessment & Plan  1. Essential hypertension BP is elevated, there is likely a component of white coat hypertension also patient has not taken her medication as she has apparently ran out. Refills were provided and asked to check blood pressure regularly and bring logs for review - losartan (COZAAR) 50 MG tablet; TAKE 1 TABLET(50 MG) BY MOUTH DAILY  Dispense: 90 tablet; Refill: 0  2. Epigastric pain Appears to be associated acid reflux as pantoprazole has helped relieve the pain. - pantoprazole (PROTONIX) 20 MG tablet; Take 1 tablet (20 mg  total) by mouth daily.  Dispense: 90 tablet; Refill: 0  3. Elevated amylase If the levels of amylase are persistently elevated, we'll recommend obtaining a CT scan of abdomen and pelvis and patient verbalized agreement - Amylase   Marshel Golubski Asad A[facility] 10/02/2017 12:19 PM

## 2017-10-03 ENCOUNTER — Ambulatory Visit (INDEPENDENT_AMBULATORY_CARE_PROVIDER_SITE_OTHER): Payer: Medicare Other | Admitting: Gastroenterology

## 2017-10-03 ENCOUNTER — Encounter: Payer: Self-pay | Admitting: Gastroenterology

## 2017-10-03 VITALS — BP 175/76 | HR 89 | Temp 97.6°F | Ht 62.0 in | Wt 138.2 lb

## 2017-10-03 DIAGNOSIS — R1013 Epigastric pain: Secondary | ICD-10-CM | POA: Diagnosis not present

## 2017-10-03 DIAGNOSIS — R748 Abnormal levels of other serum enzymes: Secondary | ICD-10-CM

## 2017-10-03 NOTE — Progress Notes (Signed)
Vonda Antigua, MD [address] [place], [place], [place], [id number] [place], [place], [place], [address] Phone: [phone]  Fax: [phone]  Consultation  Referring Provider:     Roselee Nova, MD Primary Care Physician:  Roselee Nova, MD Primary Gastroenterologist:  Dr. Bonna Gains         Reason for Referral:     Abdominal Pain  Date of Admission:  (Not on file) Date of Consultation:  10/03/2017         HPI:   Cathy Barton is a 83 y.o. female presents with abdominal pain that has now resolved but had initially started 3-4 weeks ago of 1-2 day duration.  located in the midepigastric region, 8/10 in severity, sharp in quality. Pt. Was seen by her PCP and started on Pantoprazole that has completely resolved the pain. She has not had it for 2 weeks now. No associated N/V, altered bowel habits, blood in stool, weight loss. No dysphagia or heartburn. Takes ibuprofen as needed and was taking it more often for headaches recently.  Past Medical History:  Diagnosis Date  . Diabetes mellitus without complication ([place])   . Hyperlipidemia   . Hypertension   . Meningioma ([place])   . Parkinson's disease ([place])    Followed by Neurology.    Past Surgical History:  Procedure Laterality Date  . ABDOMINAL HYSTERECTOMY    . APPENDECTOMY    . CHOLECYSTECTOMY    . SHOULDER SURGERY      Prior to Admission medications   Medication Sig Start Date End Date Taking? Authorizing Provider  aspirin 81 MG tablet Take 81 mg by mouth daily.    Yes [provider]  carbidopa-levodopa (SINEMET IR) 25-100 MG tablet Take 2 tablets by mouth 3 (three) times daily. 09/10/17  Yes [provider]  ibuprofen (ADVIL,MOTRIN) 200 MG tablet Take 1 tablet by mouth every 6 (six) hours as needed.   Yes [provider]  losartan (COZAAR) 50 MG tablet TAKE 1 TABLET(50 MG) BY MOUTH DAILY 10/02/17  Yes Keith Rake Asad A, MD  metFORMIN (GLUCOPHAGE) 500 MG tablet  TAKE 1 TABLET(500 MG) BY MOUTH TWICE DAILY WITH A MEAL Patient taking differently: Take 500 mg by mouth daily with breakfast.  02/07/17  Yes Rochel Brome A, MD  pantoprazole (PROTONIX) 20 MG tablet Take 1 tablet (20 mg total) by mouth daily. 10/02/17 12/31/17 Yes Roselee Nova, MD  pravastatin (PRAVACHOL) 40 MG tablet Take 1 tablet (40 mg total) by mouth daily. 02/07/17  Yes Roselee Nova, MD  ranitidine (ZANTAC) 150 MG capsule Take 1 capsule by mouth daily as needed.   Yes [provider]  sitaGLIPtin (JANUVIA) 100 MG tablet Take 1 tablet (100 mg total) by mouth daily. 08/13/17  Yes Roselee Nova, MD    Family History  Problem Relation Age of Onset  . Breast cancer Mother 63  . Heart disease Father   . Cancer Brother        Colon CA  . Kidney failure Brother   . Cancer Brother        Prostate CA  . Kidney failure Brother   . Kidney failure Sister   . Kidney failure Sister      Social History  Substance Use Topics  . Smoking status: Former Smoker    Types: Cigarettes  . Smokeless tobacco: Never Used     Comment: for a short time period  . Alcohol  use No    Allergies as of 10/03/2017 - Review Complete 10/03/2017  Allergen Reaction Noted  . Codeine Other (See Comments) 03/06/2016    Review of Systems:    All systems reviewed and negative except where noted in HPI.   Physical Exam:  Vital signs in last 24 hours: @VSRANGES @   General:   Pleasant, cooperative in NAD Head:  Normocephalic and atraumatic. Eyes:   No icterus.   Conjunctiva pink. PERRLA. Ears:  Normal auditory acuity. Neck:  Supple; no masses or thyroidomegaly Lungs: Respirations even and unlabored. Lungs clear to auscultation bilaterally.   No wheezes, crackles, or rhonchi.  Heart:  Regular rate and rhythm;  Without murmur, clicks, rubs or gallops Abdomen:  Soft, nondistended, nontender. Normal bowel sounds. No appreciable masses or hepatomegaly.  No rebound or guarding.  Rectal:  Not  performed. Msk:  Symmetrical without gross deformities.   Extremities:  Without edema, cyanosis or clubbing. Neurologic:  Alert and oriented x3;  grossly normal neurologically. Skin:  Intact without significant lesions or rashes. Cervical Nodes:  No significant cervical adenopathy. Psych:  Alert and cooperative. Normal affect.  LAB RESULTS: No results for input(s): WBC, HGB, HCT, PLT in the last 72 hours. BMET No results for input(s): NA, K, CL, CO2, GLUCOSE, BUN, CREATININE, CALCIUM in the last 72 hours. LFT No results for input(s): PROT, ALBUMIN, AST, ALT, ALKPHOS, BILITOT, BILIDIR, IBILI in the last 72 hours. PT/INR No results for input(s): LABPROT, INR in the last 72 hours.   Amylase elevated but much improved than before. No lipase elevation CBC, CMP reviewed as well and shows normal Hgb, Platelets, EBC and liver enzymes STUDIES: No results found.    Impression / Plan:   Cathy Barton is a 83 y.o. y/o female with epigastric abdominal pain that has resolved with PPI use with no alarm symptoms present  Abdominal pain likely related to esophagitis or gastritis or PUD related to NSAID use.  Resolution with PPI most consistent with this as well Continue daily PPI use (it is at low dose) Avoid other NSAIDs besides her ASA which she is on for her diabetes. At her age and ongoing ASA use it is beneficial to continue low dose PPI longterm as benefits outweigh risks in her case  Mild elevation in amylase with normal lipase, and normal pancreas on Abd U/S is nonspecific and with resolution of her abdominal pain and no alarm symptoms does not require further imaging at this time. If pain reoccurs or amylase does not normalize can consider CT at that time. Risks of CT and contrast would outweigh benefits at this time with resolved symptoms. She has also had a cholecystectomy in the past, and with no alcohol use and normal liver enzymes and normal Abd U/S suspicion for pancreatitis as cause  of elevated amylase is low.    Thank you for involving me in the care of this patient.     Virgel Manifold, MD  10/03/2017, 10:44 AM

## 2017-10-03 NOTE — Patient Instructions (Signed)
Avoid Ibuprofen Take acetaminophen instead of Ibuprofen Continue to take Pantoprazole as prescribed by your Primary care physician Please inform us if your abdominal pain worsens or reoccurs

## 2017-10-09 ENCOUNTER — Telehealth: Payer: Self-pay

## 2017-10-09 NOTE — Telephone Encounter (Signed)
-----   Message from Roselee Nova, MD sent at 10/03/2017  5:26 PM EDT ----- Amylase level is steadily decreasing, now at 153, there is no need for CT scan of abdomen at this time.

## 2017-10-09 NOTE — Telephone Encounter (Signed)
Not able to leave a message to go over her lab results. I keep getting a busy dial.

## 2017-10-29 ENCOUNTER — Telehealth: Payer: Self-pay

## 2017-10-29 NOTE — Telephone Encounter (Signed)
Tried contacting pt to remind her to get an Amalyse but no vm to leave message.

## 2017-10-29 NOTE — Telephone Encounter (Signed)
-----   Message from [place], [place] sent at 10/03/2017 10:49 AM EDT ----- Pt will need Amalyse prior to appt with [facility].

## 2017-11-03 ENCOUNTER — Encounter: Payer: Self-pay | Admitting: Gastroenterology

## 2017-11-03 ENCOUNTER — Ambulatory Visit: Payer: Medicare Other | Admitting: Gastroenterology

## 2017-11-04 ENCOUNTER — Ambulatory Visit: Payer: Medicare Other

## 2017-11-04 ENCOUNTER — Ambulatory Visit: Payer: Medicare Other | Admitting: Family Medicine

## 2017-11-04 ENCOUNTER — Encounter: Payer: Self-pay | Admitting: Family Medicine

## 2017-11-04 VITALS — BP 128/76 | HR 81 | Temp 97.4°F | Resp 16 | Ht 62.0 in | Wt 139.0 lb

## 2017-11-04 VITALS — BP 128/76 | HR 81 | Temp 97.4°F | Resp 16 | Ht 62.0 in | Wt 139.3 lb

## 2017-11-04 DIAGNOSIS — Z23 Encounter for immunization: Secondary | ICD-10-CM | POA: Diagnosis not present

## 2017-11-04 DIAGNOSIS — I10 Essential (primary) hypertension: Secondary | ICD-10-CM | POA: Diagnosis not present

## 2017-11-04 DIAGNOSIS — E1165 Type 2 diabetes mellitus with hyperglycemia: Secondary | ICD-10-CM | POA: Diagnosis not present

## 2017-11-04 DIAGNOSIS — IMO0001 Reserved for inherently not codable concepts without codable children: Secondary | ICD-10-CM

## 2017-11-04 DIAGNOSIS — Z1239 Encounter for other screening for malignant neoplasm of breast: Secondary | ICD-10-CM

## 2017-11-04 DIAGNOSIS — E7800 Pure hypercholesterolemia, unspecified: Secondary | ICD-10-CM

## 2017-11-04 DIAGNOSIS — Z0000 Encounter for general adult medical examination without abnormal findings: Secondary | ICD-10-CM

## 2017-11-04 LAB — POCT GLYCOSYLATED HEMOGLOBIN (HGB A1C): Hemoglobin A1C: 8

## 2017-11-04 MED ORDER — METFORMIN HCL 500 MG PO TABS: ORAL_TABLET | ORAL | 0 refills | Status: DC

## 2017-11-04 MED ORDER — SITAGLIPTIN PHOSPHATE 100 MG PO TABS: 100.0000 mg | ORAL_TABLET | Freq: Every day | ORAL | 0 refills | Status: DC

## 2017-11-04 MED ORDER — PRAVASTATIN SODIUM 40 MG PO TABS
40.0000 mg | ORAL_TABLET | Freq: Every day | ORAL | 0 refills | Status: DC
Start: 2017-11-04 — End: 2018-02-09

## 2017-11-04 NOTE — Patient Instructions (Signed)
Cathy Barton , Thank you for taking time to come for your Medicare Wellness Visit. I appreciate your ongoing commitment to your health goals. Please review the following plan we discussed and let me know if I can assist you in the future.   Screening recommendations/referrals: Colonoscopy: Colon cancer screenings no longer required Mammogram: Completed 06/06/16. Repeat mammogram every year. Please call [phone] to schedule your mammogram.  Bone Density: Completed 09/27/14. Osteoporotic screenings no longer required Recommended yearly ophthalmology/optometry visit for glaucoma screening and checkup Recommended yearly dental visit for hygiene and checkup  Vaccinations: Influenza vaccine: Given today Pneumococcal vaccine: PCV13 given today. PPSV23 given 05/02/13 Tdap vaccine: Completed 08/27/12 Shingles vaccine: Declined. Please call your insurance company to determine your out of pocket expense.  Advanced directives: Please bring a copy of your POA (Power of Attorney) and/or Living Will to your next appointment.   Conditions/risks identified: Fall risk prevention discussed  Next appointment: You are scheduled to see Dr. Manuella Ghazi on 11/04/17 @ 9:20am.   Please schedule your annual wellness exam with your Nurse Health Advisor in one year.    Preventive Care 81 Years and Older, Female Preventive care refers to lifestyle choices and visits with your health care provider that can promote health and wellness. What does preventive care include?  A yearly physical exam. This is also called an annual well check.  Dental exams once or twice a year.  Routine eye exams. Ask your health care provider how often you should have your eyes checked.  Personal lifestyle choices, including:  Daily care of your teeth and gums.  Regular physical activity.  Eating a healthy diet.  Avoiding tobacco and drug use.  Limiting alcohol use.  Practicing safe sex.  Taking low-dose aspirin every day.  Taking  vitamin and mineral supplements as recommended by your health care provider. What happens during an annual well check? The services and screenings done by your health care provider during your annual well check will depend on your age, overall health, lifestyle risk factors, and family history of disease. Counseling  Your health care provider may ask you questions about your:  Alcohol use.  Tobacco use.  Drug use.  Emotional well-being.  Home and relationship well-being.  Sexual activity.  Eating habits.  History of falls.  Memory and ability to understand (cognition).  Work and work Statistician.  Reproductive health. Screening  You may have the following tests or measurements:  Height, weight, and BMI.  Blood pressure.  Lipid and cholesterol levels. These may be checked every 5 years, or more frequently if you are over 75 years old.  Skin check.  Lung cancer screening. You may have this screening every year starting at age 81 if you have a 30-pack-year history of smoking and currently smoke or have quit within the past 15 years.  Fecal occult blood test (FOBT) of the stool. You may have this test every year starting at age 70.  Flexible sigmoidoscopy or colonoscopy. You may have a sigmoidoscopy every 5 years or a colonoscopy every 10 years starting at age 21.  Hepatitis C blood test.  Hepatitis B blood test.  Sexually transmitted disease (STD) testing.  Diabetes screening. This is done by checking your blood sugar (glucose) after you have not eaten for a while (fasting). You may have this done every 1-3 years.  Bone density scan. This is done to screen for osteoporosis. You may have this done starting at age 5.  Mammogram. This may be done every 1-2 years. Talk  to your health care provider about how often you should have regular mammograms. Talk with your health care provider about your test results, treatment options, and if necessary, the need for more  tests. Vaccines  Your health care provider may recommend certain vaccines, such as:  Influenza vaccine. This is recommended every year.  Tetanus, diphtheria, and acellular pertussis (Tdap, Td) vaccine. You may need a Td booster every 10 years.  Zoster vaccine. You may need this after age 38.  Pneumococcal 13-valent conjugate (PCV13) vaccine. One dose is recommended after age 81.  Pneumococcal polysaccharide (PPSV23) vaccine. One dose is recommended after age 81. Talk to your health care provider about which screenings and vaccines you need and how often you need them. This information is not intended to replace advice given to you by your health care provider. Make sure you discuss any questions you have with your health care provider. Document Released: 12/15/2015 Document Revised: 08/07/2016 Document Reviewed: 09/19/2015 Elsevier Interactive Patient Education  2017 [place] Prevention in the Home Falls can cause injuries. They can happen to people of all ages. There are many things you can do to make your home safe and to help prevent falls. What can I do on the outside of my home?  Regularly fix the edges of walkways and driveways and fix any cracks.  Remove anything that might make you trip as you walk through a door, such as a raised step or threshold.  Trim any bushes or trees on the path to your home.  Use bright outdoor lighting.  Clear any walking paths of anything that might make someone trip, such as rocks or tools.  Regularly check to see if handrails are loose or broken. Make sure that both sides of any steps have handrails.  Any raised decks and porches should have guardrails on the edges.  Have any leaves, snow, or ice cleared regularly.  Use sand or salt on walking paths during winter.  Clean up any spills in your garage right away. This includes oil or grease spills. What can I do in the bathroom?  Use night lights.  Install grab bars by the  toilet and in the tub and shower. Do not use towel bars as grab bars.  Use non-skid mats or decals in the tub or shower.  If you need to sit down in the shower, use a plastic, non-slip stool.  Keep the floor dry. Clean up any water that spills on the floor as soon as it happens.  Remove soap buildup in the tub or shower regularly.  Attach bath mats securely with double-sided non-slip rug tape.  Do not have throw rugs and other things on the floor that can make you trip. What can I do in the bedroom?  Use night lights.  Make sure that you have a light by your bed that is easy to reach.  Do not use any sheets or blankets that are too big for your bed. They should not hang down onto the floor.  Have a firm chair that has side arms. You can use this for support while you get dressed.  Do not have throw rugs and other things on the floor that can make you trip. What can I do in the kitchen?  Clean up any spills right away.  Avoid walking on wet floors.  Keep items that you use a lot in easy-to-reach places.  If you need to reach something above you, use a strong step stool that  has a grab bar.  Keep electrical cords out of the way.  Do not use floor polish or wax that makes floors slippery. If you must use wax, use non-skid floor wax.  Do not have throw rugs and other things on the floor that can make you trip. What can I do with my stairs?  Do not leave any items on the stairs.  Make sure that there are handrails on both sides of the stairs and use them. Fix handrails that are broken or loose. Make sure that handrails are as long as the stairways.  Check any carpeting to make sure that it is firmly attached to the stairs. Fix any carpet that is loose or worn.  Avoid having throw rugs at the top or bottom of the stairs. If you do have throw rugs, attach them to the floor with carpet tape.  Make sure that you have a light switch at the top of the stairs and the bottom of  the stairs. If you do not have them, ask someone to add them for you. What else can I do to help prevent falls?  Wear shoes that:  Do not have high heels.  Have rubber bottoms.  Are comfortable and fit you well.  Are closed at the toe. Do not wear sandals.  If you use a stepladder:  Make sure that it is fully opened. Do not climb a closed stepladder.  Make sure that both sides of the stepladder are locked into place.  Ask someone to hold it for you, if possible.  Clearly mark and make sure that you can see:  Any grab bars or handrails.  First and last steps.  Where the edge of each step is.  Use tools that help you move around (mobility aids) if they are needed. These include:  Canes.  Walkers.  Scooters.  Crutches.  Turn on the lights when you go into a dark area. Replace any light bulbs as soon as they burn out.  Set up your furniture so you have a clear path. Avoid moving your furniture around.  If any of your floors are uneven, fix them.  If there are any pets around you, be aware of where they are.  Review your medicines with your doctor. Some medicines can make you feel dizzy. This can increase your chance of falling. Ask your doctor what other things that you can do to help prevent falls. This information is not intended to replace advice given to you by your health care provider. Make sure you discuss any questions you have with your health care provider. Document Released: 09/14/2009 Document Revised: 04/25/2016 Document Reviewed: 12/23/2014 Elsevier Interactive Patient Education  2017 [employer].

## 2017-11-04 NOTE — Progress Notes (Signed)
Subjective:   Cathy Barton is a 83 y.o. female who presents for Medicare Annual (Subsequent) preventive examination.  Review of Systems:  N/A Cardiac Risk Factors include: advanced age (>76men, >55 women);diabetes mellitus;dyslipidemia;hypertension;sedentary lifestyle     Objective:     Vitals: BP 128/76 (BP Location: Left Arm, Patient Position: Sitting, Cuff Size: Normal)   Pulse 81   Temp (!) 97.4 F (36.3 C) (Oral)   Resp 16   Ht 5\' 2"  (1.575 m)   Wt 139 lb 4.8 oz (63.2 kg)   BMI 25.48 kg/m   Body mass index is 25.48 kg/m.  Advanced Directives 11/04/2017 09/18/2017 02/07/2017 01/10/2017 10/21/2016 09/18/2016 06/17/2016  Does Patient Have a Medical Advance Directive? Yes Yes No No Yes No Yes  Type of Paramedic of [place];Living will - - - Living will;Healthcare Power of [place];Living will  Copy of [place] in Chart? No - copy requested - - - No - copy requested - -  Would patient like information on creating a medical advance directive? - - - - - No - patient declined information -    Tobacco Social History   Tobacco Use  Smoking Status Former Smoker  . Packs/day: 0.25  . Years: 3.00  . Pack years: 0.75  . Types: Cigarettes  Smokeless Tobacco Never Used  Tobacco Comment   for a short time period     Counseling given: No Comment: for a short time period   Clinical Intake:  Pre-visit preparation completed: Yes  Pain : No/denies pain     BMI - recorded: 25.48 Nutritional Status: BMI 25 -29 Overweight Nutritional Risks: None Diabetes: Yes CBG done?: No Did pt. bring in CBG monitor from home?: No  Activities of Daily Living: Independent Ambulation: Independent with device- listed below Home Assistive Devices/Equipment: Eyeglasses, Dentures (specify type), Shower/tub chair, Other (Comment)(partial upper dentures; grab bars in shower) Medication Administration: Independent Home  Management: Independent  Barriers to Care Management & Learning: None  Do you feel unsafe in your current relationship?: No(widowed) Do you feel physically threatened by others?: No Anyone hurting you at home, work, or school?: No  How often do you need to have someone help you when you read instructions, pamphlets, or other written materials from your doctor or pharmacy?: 1 - Never What is the last grade level you completed in school?: doctorate in education  Interpreter Needed?: No  Information entered by :: Idell Pickles, LPN  Past Medical History:  Diagnosis Date  . Diabetes mellitus without complication ([place])   . Hyperlipidemia   . Hypertension   . Meningioma ([place])   . Parkinson's disease ([place])    Followed by Neurology.   Past Surgical History:  Procedure Laterality Date  . ABDOMINAL HYSTERECTOMY    . APPENDECTOMY    . CHOLECYSTECTOMY    . SHOULDER SURGERY     Family History  Problem Relation Age of Onset  . Breast cancer Mother 3  . Heart disease Father   . Healthy Sister   . Cancer Brother        Colon CA  . Kidney failure Brother   . Cancer Brother        Prostate CA  . Kidney failure Brother   . Kidney failure Sister   . Kidney failure Sister    Social History   Socioeconomic History  . Marital status: Widowed    Spouse name: None  . Number of children: 2  .  Years of education: None  . Highest education level: None  Social Needs  . Financial resource strain: Not hard at all  . Food insecurity - worry: Never true  . Food insecurity - inability: Never true  . Transportation needs - medical: No  . Transportation needs - non-medical: No  Occupational History  . Occupation: Retired  Tobacco Use  . Smoking status: Former Smoker    Packs/day: 0.25    Years: 3.00    Pack years: 0.75    Types: Cigarettes  . Smokeless tobacco: Never Used  . Tobacco comment: for a short time period  Substance and Sexual Activity  . Alcohol use: No    Alcohol/week: 0.0  oz  . Drug use: No  . Sexual activity: No  Other Topics Concern  . None  Social History Narrative  . None    Outpatient Encounter Medications as of 11/04/2017  Medication Sig  . Amantadine HCl 100 MG tablet   . aspirin 81 MG tablet Take 81 mg by mouth daily.   . carbidopa-levodopa (SINEMET IR) 25-100 MG tablet Take 2 tablets by mouth 3 (three) times daily.  Marland Kitchen ibuprofen (ADVIL,MOTRIN) 200 MG tablet Take 1 tablet by mouth every 6 (six) hours as needed.  Marland Kitchen losartan (COZAAR) 50 MG tablet TAKE 1 TABLET(50 MG) BY MOUTH DAILY  . metFORMIN (GLUCOPHAGE) 500 MG tablet TAKE 1 TABLET(500 MG) BY MOUTH TWICE DAILY WITH A MEAL (Patient taking differently: Take 500 mg by mouth daily with breakfast. )  . pantoprazole (PROTONIX) 20 MG tablet Take 1 tablet (20 mg total) by mouth daily.  . pravastatin (PRAVACHOL) 40 MG tablet Take 1 tablet (40 mg total) by mouth daily.  . ranitidine (ZANTAC) 150 MG capsule Take 1 capsule by mouth daily as needed.  . sitaGLIPtin (JANUVIA) 100 MG tablet Take 1 tablet (100 mg total) by mouth daily.   No facility-administered encounter medications on file as of 11/04/2017.     Activities of Daily Living In your present state of health, do you have any difficulty performing the following activities: 11/04/2017 11/04/2017  Hearing? N N  Vision? Y N  Comment wears eyeglasses wears eyeglasses  Difficulty concentrating or making decisions? N N  Walking or climbing stairs? N N  Comment - -  Dressing or bathing? N N  Doing errands, shopping? N N  Preparing Food and eating ? - N  Using the Toilet? - N  In the past six months, have you accidently leaked urine? - Y  Comment - urgency incontinence  Do you have problems with loss of bowel control? - N  Managing your Medications? - N  Managing your Finances? - N  Housekeeping or managing your Housekeeping? - N  Some recent data might be hidden    Timed Get Up and Go performed: Yes. 12 sec. Gait steady. No intervention  required  Patient Care Team: Roselee Nova, MD as PCP - General (Family Medicine) Vladimir Crofts, MD as Consulting Physician (Neurology) Ubaldo Glassing Javier Docker, MD as Consulting Physician (Cardiology)    Assessment:    Exercise Activities and Dietary recommendations Current Exercise Habits: The patient does not participate in regular exercise at present, Type of exercise: walking, Time (Minutes): 30, Frequency (Times/Week): 3, Weekly Exercise (Minutes/Week): 90, Intensity: Mild, Exercise limited by: None identified  Goals    . Exercise 150 min/wk Moderate Activity     Recommend to exercise at least 150 minutes per week.      Fall Risk Fall Risk  11/04/2017 09/18/2017 02/07/2017 01/10/2017 10/21/2016  Falls in the past year? No No No No No   Is the patient's home free of loose throw rugs in walkways, pet beds, electrical cords, etc?   yes      Grab bars in the bathroom? yes      Handrails on the stairs?   yes      Adequate lighting?   yes  Depression Screen PHQ 2/9 Scores 11/04/2017 09/18/2017 02/07/2017 01/10/2017  PHQ - 2 Score 0 0 0 0     Cognitive Function     6CIT Screen 11/04/2017 10/21/2016  What Year? 0 points 0 points  What month? 0 points 0 points  What time? 0 points 0 points  Count back from 20 0 points 0 points  Months in reverse 0 points 0 points  Repeat phrase 0 points 6 points  Total Score 0 6    Immunization History  Administered Date(s) Administered  . Influenza, High Dose Seasonal PF 09/18/2016  . Influenza-Unspecified 08/02/2014  . Pneumococcal Polysaccharide-23 05/02/2013  . Tdap 08/27/2012   Screening Tests Health Maintenance  Topic Date Due  . OPHTHALMOLOGY EXAM  04/25/1940  . MAMMOGRAM  06/06/2017  . HEMOGLOBIN A1C  08/10/2017  . FOOT EXAM  09/18/2017  . TETANUS/TDAP  08/27/2022  . INFLUENZA VACCINE  Completed  . DEXA SCAN  Completed  . PNA vac Low Risk Adult  Completed   Cancer Screenings: Lung: Low Dose CT Chest recommended if Age 63-80  years, 30 pack-year currently smoking OR have quit w/in 15years. Patient does not qualify. Breast: Up to date on Mammogram? Yes  Completed 06/06/16. Repeat mammogram in one year. Ordered today. Pt given contact information to call and schedule her appointment. Up to date of Bone Density/Dexa? Yes Osteoporotic screenings no longer required Colorectal: Colon cancer screenings no longer required  Additional Screenings: HIV screening: does not qualify Hepatitis C Screening: does not qualify      Plan:   I have personally reviewed and addressed the Medicare Annual Wellness questionnaire and have noted the following in the patient's chart:  A. Medical and social history B. Use of alcohol, tobacco or illicit drugs  C. Current medications and supplements D. Functional ability and status E.  Nutritional status F.  Physical activity G. Advance directives and Code Status: Pt provided with MOST and DNR documents for his/her review. Pt has been advised that he/she will only need to complete the document that is most appropriate to suit his/her health care wishes. Once completed, pt has been advised to return the appropriate document to our office for the physician to review and sign. H. List of other physicians I.  Hospitalizations, surgeries, and ER visits in previous 12 months J.  [place] such as hearing and vision if needed, cognitive and depression L. Referrals and appointments - none  In addition, I have reviewed and discussed with patient certain preventive protocols, quality metrics, and best practice recommendations. A written personalized care plan for preventive services as well as general preventive health recommendations were provided to patient.  See attached scanned questionnaire for additional information.   Signed,  Aleatha Borer, LPN Nurse Health Advisor

## 2017-11-04 NOTE — Progress Notes (Signed)
Name: Cathy Barton   MRN: [id number]    DOB: 01/28/1930   Date:11/04/2017       Progress Note  Subjective  Chief Complaint  Chief Complaint  Patient presents with  . Diabetes    4 week f/u  . Hypertension    4 week f/u  . Hyperlipidemia    4 week f/u  . Medication Refill    Diabetes  She presents for her follow-up diabetic visit. She has type 2 diabetes mellitus. Her disease course has been stable. There are no hypoglycemic associated symptoms. Pertinent negatives for hypoglycemia include no dizziness, headaches (occasional bedtime headaches.), pallor or sweats. Associated symptoms include foot paresthesias (occasional numbness in feet.). Pertinent negatives for diabetes include no blurred vision, no chest pain, no polydipsia and no polyuria. There are no hypoglycemic complications. Pertinent negatives for diabetic complications include no CVA, heart disease or peripheral neuropathy. Risk factors for coronary artery disease include dyslipidemia and diabetes mellitus. Current diabetic treatment includes oral agent (dual therapy). She is following a diabetic and generally healthy diet. She participates in exercise three times a week. She monitors blood glucose at home 1-2 x per day. Her breakfast blood glucose range is generally 130-140 mg/dl. An ACE inhibitor/angiotensin II receptor blocker is being taken. Eye exam is current.  Hypertension  This is a chronic problem. The problem is unchanged. The problem is controlled. Pertinent negatives include no blurred vision, chest pain, headaches (occasional bedtime headaches.), palpitations, shortness of breath or sweats. Past treatments include angiotensin blockers. There is no history of kidney disease, CAD/MI or CVA.  Hyperlipidemia  This is a chronic problem. The problem is controlled. Recent lipid tests were reviewed and are normal. Pertinent negatives include no chest pain, leg pain, myalgias or shortness of breath. Current antihyperlipidemic  treatment includes statins.     Past Medical History:  Diagnosis Date  . Diabetes mellitus without complication ([place])   . Hyperlipidemia   . Hypertension   . Meningioma ([place])   . Parkinson's disease ([place])    Followed by Neurology.    Past Surgical History:  Procedure Laterality Date  . ABDOMINAL HYSTERECTOMY    . APPENDECTOMY    . CHOLECYSTECTOMY    . SHOULDER SURGERY      Family History  Problem Relation Age of Onset  . Breast cancer Mother 48  . Heart disease Father   . Healthy Sister   . Cancer Brother        Colon CA  . Kidney failure Brother   . Cancer Brother        Prostate CA  . Kidney failure Brother   . Kidney failure Sister   . Kidney failure Sister     Social History   Socioeconomic History  . Marital status: Widowed    Spouse name: Not on file  . Number of children: 2  . Years of education: Not on file  . Highest education level: Not on file  Social Needs  . Financial resource strain: Not hard at all  . Food insecurity - worry: Never true  . Food insecurity - inability: Never true  . Transportation needs - medical: No  . Transportation needs - non-medical: No  Occupational History  . Occupation: Retired  Tobacco Use  . Smoking status: Former Smoker    Packs/day: 0.25    Years: 3.00    Pack years: 0.75    Types: Cigarettes  . Smokeless tobacco: Never Used  . Tobacco comment: for a short time period  Substance and Sexual Activity  . Alcohol use: No    Alcohol/week: 0.0 oz  . Drug use: No  . Sexual activity: No  Other Topics Concern  . Not on file  Social History Narrative  . Not on file     Current Outpatient Medications:  .  Amantadine HCl 100 MG tablet, , Disp: , Rfl: 5 .  aspirin 81 MG tablet, Take 81 mg by mouth daily. , Disp: , Rfl:  .  carbidopa-levodopa (SINEMET IR) 25-100 MG tablet, Take 2 tablets by mouth 3 (three) times daily., Disp: , Rfl: 1 .  ibuprofen (ADVIL,MOTRIN) 200 MG tablet, Take 1 tablet by mouth every 6  (six) hours as needed., Disp: , Rfl:  .  losartan (COZAAR) 50 MG tablet, TAKE 1 TABLET(50 MG) BY MOUTH DAILY, Disp: 90 tablet, Rfl: 0 .  metFORMIN (GLUCOPHAGE) 500 MG tablet, TAKE 1 TABLET(500 MG) BY MOUTH TWICE DAILY WITH A MEAL (Patient taking differently: Take 500 mg by mouth daily with breakfast. ), Disp: 180 tablet, Rfl: 0 .  pantoprazole (PROTONIX) 20 MG tablet, Take 1 tablet (20 mg total) by mouth daily., Disp: 90 tablet, Rfl: 0 .  pravastatin (PRAVACHOL) 40 MG tablet, Take 1 tablet (40 mg total) by mouth daily., Disp: 90 tablet, Rfl: 0 .  ranitidine (ZANTAC) 150 MG capsule, Take 1 capsule by mouth daily as needed., Disp: , Rfl:  .  sitaGLIPtin (JANUVIA) 100 MG tablet, Take 1 tablet (100 mg total) by mouth daily., Disp: 30 tablet, Rfl: 0  Allergies  Allergen Reactions  . Codeine Other (See Comments)     Review of Systems  Eyes: Negative for blurred vision.  Respiratory: Negative for shortness of breath.   Cardiovascular: Negative for chest pain and palpitations.  Musculoskeletal: Negative for myalgias.  Skin: Negative for pallor.  Neurological: Negative for dizziness and headaches (occasional bedtime headaches.).  Endo/Heme/Allergies: Negative for polydipsia.      Objective  Vitals:   11/04/17 0917  BP: 128/76  Pulse: 81  Resp: 16  Temp: (!) 97.4 F (36.3 C)  TempSrc: Oral  SpO2: 96%  Weight: 139 lb (63 kg)  Height: 5\' 2"  (1.575 m)    Physical Exam  Constitutional: She is oriented to person, place, and time and well-developed, well-nourished, and in no distress.  HENT:  Head: Normocephalic and atraumatic.  Cardiovascular: Normal rate, regular rhythm and normal heart sounds.  No murmur heard. Pulmonary/Chest: Breath sounds normal. No respiratory distress. She has no wheezes.  Abdominal: Soft. Bowel sounds are normal. There is no tenderness.  Musculoskeletal: She exhibits no edema.  Neurological: She is alert and oriented to person, place, and time.   Psychiatric: Mood, memory, affect and judgment normal.  Nursing note and vitals reviewed.    Assessment & Plan  1. Uncontrolled type 2 diabetes mellitus without complication, without long-term current use of insulin (HCC) Point-of-care A1c is 8.0%, appropriate control, continue on metformin and Januvia - POCT HgB A1C - Urine Microalbumin w/creat. ratio - metFORMIN (GLUCOPHAGE) 500 MG tablet; TAKE 1 TABLET(500 MG) BY MOUTH TWICE DAILY WITH A MEAL  Dispense: 180 tablet; Refill: 0 - sitaGLIPtin (JANUVIA) 100 MG tablet; Take 1 tablet (100 mg total) by mouth daily.  Dispense: 30 tablet; Refill: 0  2. Pure hypercholesterolemia  - Lipid panel - pravastatin (PRAVACHOL) 40 MG tablet; Take 1 tablet (40 mg total) by mouth daily.  Dispense: 90 tablet; Refill: 0  3. Essential hypertension Blood pressure stable, continue on ARB  Melita Villalona Asad A. Charna Archer  [facility] 11/04/2017 10:04 AM

## 2017-11-05 LAB — LIPID PANEL
Cholesterol: 161 mg/dL (ref <200)
HDL: 60 mg/dL (ref >50)
LDL CHOLESTEROL (CALC): 81 mg/dL
NON-HDL CHOLESTEROL (CALC): 101 mg/dL (ref <130)
TRIGLYCERIDES: 102 mg/dL (ref <150)
Total CHOL/HDL Ratio: 2.7 (calc) (ref <5.0)

## 2017-11-05 LAB — MICROALBUMIN / CREATININE URINE RATIO
Creatinine, Urine: 93 mg/dL (ref 20–275)
MICROALB/CREAT RATIO: 54 ug/mg{creat} — AB (ref <30)
Microalb, Ur: 5 mg/dL

## 2018-01-01 ENCOUNTER — Other Ambulatory Visit: Payer: Self-pay | Admitting: Family Medicine

## 2018-01-01 DIAGNOSIS — I10 Essential (primary) hypertension: Secondary | ICD-10-CM

## 2018-01-12 ENCOUNTER — Encounter: Payer: Self-pay | Admitting: Family Medicine

## 2018-01-16 ENCOUNTER — Other Ambulatory Visit: Payer: Self-pay | Admitting: Family Medicine

## 2018-01-16 DIAGNOSIS — R1013 Epigastric pain: Secondary | ICD-10-CM

## 2018-02-03 ENCOUNTER — Ambulatory Visit: Payer: Medicare Other | Admitting: Family Medicine

## 2018-02-09 ENCOUNTER — Other Ambulatory Visit: Payer: Self-pay

## 2018-02-09 ENCOUNTER — Other Ambulatory Visit: Payer: Self-pay | Admitting: Family Medicine

## 2018-02-09 DIAGNOSIS — E7800 Pure hypercholesterolemia, unspecified: Secondary | ICD-10-CM

## 2018-02-09 MED ORDER — PRAVASTATIN SODIUM 40 MG PO TABS: 40.0000 mg | ORAL_TABLET | Freq: Every day | ORAL | 0 refills | Status: DC

## 2018-02-09 NOTE — Telephone Encounter (Signed)
Called pt to schedule an appt and pt states she is trying to find a new PCP closer to her home.

## 2018-02-09 NOTE — Telephone Encounter (Signed)
Rx last filled, 11/04/17. Pt cancelled appt with you on 02/03/18. Please advise

## 2018-02-09 NOTE — Telephone Encounter (Signed)
I have no idea why it says the provider canceled the appointment on 02/03/18; I was here Please change that to patient canceled if appropriate She needs an appointment please since her last A1c showed uncontrolled diabetes Thank you

## 2018-04-11 ENCOUNTER — Other Ambulatory Visit: Payer: Self-pay

## 2018-04-11 ENCOUNTER — Emergency Department: Payer: Medicare Other

## 2018-04-11 ENCOUNTER — Inpatient Hospital Stay
Admission: EM | Admit: 2018-04-11 | Discharge: 2018-04-13 | DRG: 535 | Disposition: A | Discharge status: Skilled Nursing Facility | Payer: Medicare Other | Attending: Internal Medicine | Admitting: Internal Medicine

## 2018-04-11 ENCOUNTER — Encounter: Payer: Self-pay | Admitting: Emergency Medicine

## 2018-04-11 DIAGNOSIS — S32512A Fracture of superior rim of left pubis, initial encounter for closed fracture: Principal | ICD-10-CM | POA: Diagnosis present

## 2018-04-11 DIAGNOSIS — S3282XA Multiple fractures of pelvis without disruption of pelvic ring, initial encounter for closed fracture: Secondary | ICD-10-CM | POA: Diagnosis present

## 2018-04-11 DIAGNOSIS — S32412A Displaced fracture of anterior wall of left acetabulum, initial encounter for closed fracture: Secondary | ICD-10-CM | POA: Diagnosis not present

## 2018-04-11 DIAGNOSIS — Z7982 Long term (current) use of aspirin: Secondary | ICD-10-CM | POA: Diagnosis not present

## 2018-04-11 DIAGNOSIS — W010XXA Fall on same level from slipping, tripping and stumbling without subsequent striking against object, initial encounter: Secondary | ICD-10-CM | POA: Diagnosis present

## 2018-04-11 DIAGNOSIS — Z87891 Personal history of nicotine dependence: Secondary | ICD-10-CM

## 2018-04-11 DIAGNOSIS — K219 Gastro-esophageal reflux disease without esophagitis: Secondary | ICD-10-CM | POA: Diagnosis not present

## 2018-04-11 DIAGNOSIS — Y92009 Unspecified place in unspecified non-institutional (private) residence as the place of occurrence of the external cause: Secondary | ICD-10-CM | POA: Diagnosis not present

## 2018-04-11 DIAGNOSIS — G20 Parkinson's disease: Secondary | ICD-10-CM | POA: Diagnosis present

## 2018-04-11 DIAGNOSIS — E1122 Type 2 diabetes mellitus with diabetic chronic kidney disease: Secondary | ICD-10-CM | POA: Diagnosis present

## 2018-04-11 DIAGNOSIS — E785 Hyperlipidemia, unspecified: Secondary | ICD-10-CM | POA: Diagnosis present

## 2018-04-11 DIAGNOSIS — N183 Chronic kidney disease, stage 3 (moderate): Secondary | ICD-10-CM | POA: Diagnosis present

## 2018-04-11 DIAGNOSIS — Z7984 Long term (current) use of oral hypoglycemic drugs: Secondary | ICD-10-CM

## 2018-04-11 DIAGNOSIS — S329XXA Fracture of unspecified parts of lumbosacral spine and pelvis, initial encounter for closed fracture: Secondary | ICD-10-CM | POA: Diagnosis present

## 2018-04-11 DIAGNOSIS — E1165 Type 2 diabetes mellitus with hyperglycemia: Secondary | ICD-10-CM | POA: Diagnosis present

## 2018-04-11 DIAGNOSIS — I129 Hypertensive chronic kidney disease with stage 1 through stage 4 chronic kidney disease, or unspecified chronic kidney disease: Secondary | ICD-10-CM | POA: Diagnosis present

## 2018-04-11 DIAGNOSIS — Z79899 Other long term (current) drug therapy: Secondary | ICD-10-CM | POA: Diagnosis not present

## 2018-04-11 DIAGNOSIS — Z86011 Personal history of benign neoplasm of the brain: Secondary | ICD-10-CM | POA: Diagnosis not present

## 2018-04-11 LAB — CBC WITH DIFFERENTIAL/PLATELET
BASOS PCT: 1 %
Basophils Absolute: 0.1 10*3/uL (ref 0–0.1)
EOS ABS: 0 10*3/uL (ref 0–0.7)
EOS PCT: 0 %
HCT: 33.1 % — ABNORMAL LOW (ref 35.0–47.0)
Hemoglobin: 11.3 g/dL — ABNORMAL LOW (ref 12.0–16.0)
Lymphocytes Relative: 5 %
Lymphs Abs: 0.9 10*3/uL — ABNORMAL LOW (ref 1.0–3.6)
MCH: 28.4 pg (ref 26.0–34.0)
MCHC: 34.1 g/dL (ref 32.0–36.0)
MCV: 83.2 fL (ref 80.0–100.0)
MONOS PCT: 7 %
Monocytes Absolute: 1.1 10*3/uL — ABNORMAL HIGH (ref 0.2–0.9)
Neutro Abs: 15 10*3/uL — ABNORMAL HIGH (ref 1.4–6.5)
Neutrophils Relative %: 87 %
PLATELETS: 175 10*3/uL (ref 150–440)
RBC: 3.97 MIL/uL (ref 3.80–5.20)
RDW: 14.3 % (ref 11.5–14.5)
WBC: 17.1 10*3/uL — AB (ref 3.6–11.0)

## 2018-04-11 LAB — BASIC METABOLIC PANEL
Anion gap: 7 (ref 5–15)
BUN: 27 mg/dL — ABNORMAL HIGH (ref 6–20)
CO2: 22 mmol/L (ref 22–32)
Calcium: 9.6 mg/dL (ref 8.9–10.3)
Chloride: 106 mmol/L (ref 101–111)
Creatinine, Ser: 1.13 mg/dL — ABNORMAL HIGH (ref 0.44–1.00)
GFR, EST AFRICAN AMERICAN: 50 mL/min — AB (ref >60)
GFR, EST NON AFRICAN AMERICAN: 43 mL/min — AB (ref >60)
Glucose, Bld: 238 mg/dL — ABNORMAL HIGH (ref 65–99)
POTASSIUM: 4.1 mmol/L (ref 3.5–5.1)
SODIUM: 135 mmol/L (ref 135–145)

## 2018-04-11 MED ORDER — OXYCODONE-ACETAMINOPHEN 5-325 MG PO TABS
1.0000 | ORAL_TABLET | Freq: Once | ORAL | Status: AC
  Administered 2018-04-11: 1 via ORAL
  Filled 2018-04-11: qty 1

## 2018-04-11 MED ORDER — MORPHINE SULFATE (PF) 4 MG/ML IV SOLN: 4.0000 mg | INTRAVENOUS | Status: DC | PRN

## 2018-04-11 MED ORDER — MORPHINE SULFATE (PF) 2 MG/ML IV SOLN: 2.0000 mg | INTRAVENOUS | Status: DC | PRN

## 2018-04-11 NOTE — ED Notes (Signed)
Patient transported to CT 

## 2018-04-11 NOTE — ED Triage Notes (Signed)
Pt says her leg gave away while she was at home and she fell to the left side; painful to bear weight since; incident occurred about 90 minutes ago;

## 2018-04-11 NOTE — ED Notes (Signed)
Pt assisted with urination. Bed sheets changed due to soiling.

## 2018-04-11 NOTE — ED Notes (Signed)
Assisted with bed pan.

## 2018-04-11 NOTE — ED Notes (Signed)
hospitalist in to see pt. Attempt to call report to floor without success.

## 2018-04-11 NOTE — H&P (Addendum)
[facility] NAME: Cathy Barton    MR#:  [id number]  DATE OF BIRTH:  1930/01/18  DATE OF ADMISSION:  04/11/2018  PRIMARY CARE PHYSICIAN: Valera Castle, MD   REQUESTING/REFERRING PHYSICIAN: Nance Pear, MD  CHIEF COMPLAINT:   Chief Complaint  Patient presents with  . Hip Pain    HISTORY OF PRESENT ILLNESS:  Cathy Barton  is a 84 y.o. female with a known history of T2NIDDM, HTN, HLD, GERD, Parkinson's disease, meningioma (L frontal), presbycusis who p/w 1d Hx mechanical fall, L hip pain. Hx obtained from pt and her son (DPOA) [name] at bedside. Pt lives in a single-floor house. Her son lives w/ her. She owns a cane and a walker, but typically walks w/o assistive device. She denies frequent falls. Her last significant fall was in 2013, at which time she sustained a L humerus fracture (managed non-operatively). The pt's son states that her gait has gradually deteriorated over time, and pt has experienced progressively-worsening imbalance/dysequilibrium, as well as intermittent lightheadedness, leg weakness and generalized weakness, associated w/ progression of pt's Parkinson's disease. The pt does not exhibit dementia, is AAOx3 and does not have memory impairment. She is typically fairly active, and endorses good QoL.  On Saturday (04/11/2018), @~1800-1830PM, pt was walking down the hallway to the laundry room whilst carrying a load of laundry in one arm. She states she fell towards her left side and backwards. She is unsure of the mechanics of the fall, and cannot tell me if she tripped, slipped and/or stumbled. She denies lightheadedness prior to falling. She states she hit her left hip against the ground "hard". She did not hit her head. She denies sustaining any other injuries. She called out to her son. Her son heard a "thud" and came to see that his mother had fallen. The pt denied any pain immediately following the fall. As such, her  son helped her to stand up from the floor by pulling her up by her pants and having her hold/stand with a walker. She was unable to bear weight on her left leg, and states she balanced on her right leg. Her son got her into a wheelchair. She still did not have any pain. Her son helped her into the car and drove her to the [facility] ED. She only developed L hip/groin pain afterwards. At the time of my evaluation, the pt states she is comfortable and pain-free after receiving Oxycodone in the ED. She has been able to use the bedpan.  Pt denies F/C/N/V/D/AP, CP, SOB, palpitations, diaphoresis, rigors, night sweats, HA, vertigo, blurred vision, LH, LOC, urinary symptoms.  PAST MEDICAL HISTORY:   Past Medical History:  Diagnosis Date  . Diabetes mellitus without complication ([place])   . Hyperlipidemia   . Hypertension   . Meningioma ([place])   . Parkinson's disease ([place])    Followed by Neurology.    PAST SURGICAL HISTORY:   Past Surgical History:  Procedure Laterality Date  . ABDOMINAL HYSTERECTOMY    . APPENDECTOMY    . CHOLECYSTECTOMY    . SHOULDER SURGERY      SOCIAL HISTORY:   Social History   Tobacco Use  . Smoking status: Former Smoker    Packs/day: 0.25    Years: 3.00    Pack years: 0.75    Types: Cigarettes  . Smokeless tobacco: Never Used  . Tobacco comment: for a short time period  Substance Use Topics  . Alcohol use:  No    Alcohol/week: 0.0 oz    FAMILY HISTORY:   Family History  Problem Relation Age of Onset  . Breast cancer Mother 45  . Heart disease Father   . Healthy Sister   . Cancer Brother        Colon CA  . Kidney failure Brother   . Cancer Brother        Prostate CA  . Kidney failure Brother   . Kidney failure Sister   . Kidney failure Sister     DRUG ALLERGIES:   Allergies  Allergen Reactions  . Codeine Other (See Comments)    REVIEW OF SYSTEMS:   Review of Systems  Constitutional: Positive for malaise/fatigue (+) generalized weakness.  Negative for chills, diaphoresis, fever and weight loss.  HENT: Positive for hearing loss (+) presbycusis. Negative for congestion, ear discharge, ear pain, nosebleeds, sinus pain, sore throat and tinnitus.   Eyes: Negative for blurred vision, double vision and photophobia.  Respiratory: Negative for cough, hemoptysis, sputum production, shortness of breath and wheezing.   Cardiovascular: Negative for chest pain, palpitations, orthopnea, claudication, leg swelling and PND.  Gastrointestinal: Negative for abdominal pain, blood in stool, constipation, diarrhea, heartburn, melena, nausea and vomiting.  Genitourinary: Negative for dysuria, flank pain, frequency, hematuria and urgency.  Musculoskeletal: Positive for falls and joint pain (+) L hip pain. Negative for back pain, myalgias and neck pain.  Skin: Negative for itching and rash.  Neurological: Positive for tremors (+) baseline tremor (Parkinson's) and weakness (+) generalized weakness. Negative for dizziness, tingling, sensory change, speech change, focal weakness, seizures, loss of consciousness and headaches.  Psychiatric/Behavioral: Negative for memory loss.   MEDICATIONS AT HOME:   Prior to Admission medications   Medication Sig Start Date End Date Taking? Authorizing Provider  Amantadine HCl 100 MG tablet  10/15/17  Yes [provider]  aspirin 81 MG tablet Take 81 mg by mouth daily.    Yes [provider]  carbidopa-levodopa (SINEMET IR) 25-100 MG tablet Take 2 tablets by mouth 3 (three) times daily. 09/10/17  Yes [provider]  ibuprofen (ADVIL,MOTRIN) 200 MG tablet Take 1 tablet by mouth every 6 (six) hours as needed.   Yes [provider]  losartan (COZAAR) 50 MG tablet TAKE 1 TABLET(50 MG) BY MOUTH DAILY 01/01/18  Yes Keith Rake Asad A, MD  metFORMIN (GLUCOPHAGE) 500 MG tablet TAKE 1 TABLET(500 MG) BY MOUTH TWICE DAILY WITH A MEAL 11/04/17  Yes Keith Rake Asad A, MD  pantoprazole (PROTONIX) 20 MG  tablet TAKE 1 TABLET(20 MG) BY MOUTH DAILY 01/16/18  Yes Roselee Nova, MD  pravastatin (PRAVACHOL) 40 MG tablet Take 1 tablet (40 mg total) by mouth at bedtime. 02/09/18  Yes Lada, Satira Anis, MD  ranitidine (ZANTAC) 150 MG capsule Take 1 capsule by mouth daily as needed.   Yes [provider]  sitaGLIPtin (JANUVIA) 100 MG tablet Take 1 tablet (100 mg total) by mouth daily. 11/04/17  Yes Roselee Nova, MD      VITAL SIGNS:  Blood pressure (!) 151/89, pulse (!) 106, temperature 98.5 F (36.9 C), temperature source Oral, resp. rate 18, height 5\' 2"  (1.575 m), weight 64 kg (141 lb), SpO2 94 %.  PHYSICAL EXAMINATION:  Physical Exam  Constitutional: She is oriented to person, place, and time. She appears well-developed and well-nourished. She is active and cooperative.  Non-toxic appearance. She does not have a sickly appearance. She does not appear ill. No distress.  HENT:  Head: Normocephalic and atraumatic.  Mouth/Throat: Oropharynx is clear and moist. Mucous membranes are dry. No oropharyngeal exudate.  Eyes: Conjunctivae, EOM and lids are normal. No scleral icterus.  Neck: Neck supple. No JVD present. No thyromegaly present.  Cardiovascular: Regular rhythm, S1 normal, S2 normal and normal heart sounds.  No extrasystoles are present. Bradycardia present. Exam reveals no gallop, no S3, no S4, no distant heart sounds and no friction rub.  No murmur heard. Pulmonary/Chest: Effort normal and breath sounds normal. No accessory muscle usage or stridor. No apnea, no tachypnea and no bradypnea. No respiratory distress. She has no decreased breath sounds. She has no wheezes. She has no rhonchi. She has no rales.  Abdominal: Soft. Bowel sounds are normal. She exhibits no distension. There is no tenderness. There is no rebound and no guarding.  Musculoskeletal: She exhibits no edema.       Left hip: She exhibits decreased range of motion, tenderness and bony tenderness.  Lymphadenopathy:      She has no cervical adenopathy.  Neurological: She is alert and oriented to person, place, and time. She is not disoriented.  Skin: Skin is warm, dry and intact. No rash noted. She is not diaphoretic. No erythema.  Psychiatric: Judgment and thought content normal. Her mood appears not anxious. Her affect is blunt. Her affect is not angry, not labile and not inappropriate. Her speech is delayed. Her speech is not rapid and/or pressured, not tangential and not slurred. She is slowed. She is not agitated, not aggressive, not hyperactive, not withdrawn, not actively hallucinating and not combative. Thought content is not paranoid and not delusional. Cognition and memory are normal. Cognition and memory are not impaired. She does not express impulsivity or inappropriate judgment. She does not exhibit a depressed mood. She is communicative. She exhibits normal recent memory and normal remote memory. She is attentive.   LABORATORY PANEL:   CBC Recent Labs  Lab 04/11/18 2314  WBC 17.1*  HGB 11.3*  HCT 33.1*  PLT 175   ------------------------------------------------------------------------------------------------------------------  Chemistries  Recent Labs  Lab 04/11/18 2314  NA 135  K 4.1  CL 106  CO2 22  GLUCOSE 238*  BUN 27*  CREATININE 1.13*  CALCIUM 9.6   ------------------------------------------------------------------------------------------------------------------  Cardiac Enzymes No results for input(s): TROPONINI in the last 168 hours. ------------------------------------------------------------------------------------------------------------------  RADIOLOGY:  Ct Hip Left Wo Contrast  Result Date: 04/11/2018 CLINICAL DATA:  Left hip pain after fall. EXAM: CT OF THE LEFT HIP WITHOUT CONTRAST TECHNIQUE: Multidetector CT imaging of the left hip was performed according to the standard protocol. Multiplanar CT image reconstructions were also generated. COMPARISON:   04/11/2018 radiographs of the left hip FINDINGS: Bones/Joint/Cartilage Acute appearing left anterior wall fracture of the acetabulum without significant displacement, series 3/24. Trace joint effusion. Acute minimally displaced left inferior pubic ramus fracture with fracture at the junction of the left superior pubic ramus and acetabulum, series 3/42 and series 5/24. No hip joint dislocation. Enthesopathy of the gluteal muscle attachments of the greater trochanter. Capsular calcifications about the left hip. No proximal femoral fracture. Ligaments Suboptimally assessed on CT. Muscles and Tendons No muscle atrophy. No intramuscular hemorrhage. Soft tissues No subcutaneous hematoma.  No significant joint effusion. IMPRESSION: 1. Acute nondisplaced appearing fractures of the left inferior and superior pubic ramus, the superior pubic ramus fracture at the junction of the acetabulum and pubic ramus. 2. Minimally displaced anterior wall fracture of the left acetabulum. 3. No joint dislocation.  No proximal femoral fracture. Electronically Signed  By: Ashley Royalty M.D.   On: 04/11/2018 22:24   Dg Hip Unilat With Pelvis 2-3 Views Left  Result Date: 04/11/2018 CLINICAL DATA:  84 year old female status post fall at home on the left side today. Subsequent painful weight-bearing. EXAM: DG HIP (WITH OR WITHOUT PELVIS) 2-3V LEFT COMPARISON:  None. FINDINGS: The femoral heads are normally located. The hip joint spaces appear symmetric. No pelvis fracture identified. Heterotopic ossification about both greater trochanters, more bulky on the right. Bilateral femoral head and acetabular osteophytosis. Grossly intact proximal right femur. No proximal left femur fracture identified. Superimposed iliofemoral calcified atherosclerosis. Negative visible bowel gas pattern. IMPRESSION: No acute fracture or dislocation identified about the left hip or pelvis. If occult hip fracture is suspected or if the patient is unable to weightbear,  MRI is the preferred modality for further evaluation. Electronically Signed   By: Genevie Ann M.D.   On: 04/11/2018 21:12   IMPRESSION AND PLAN:   A/P: 38F mechanical fall w/ pelvic fracture.  1.) Mechanical fall + pelvic fracture: Pt p/w 1d Hx mechanical fall, as per HPI. Last fall in 2013. Has Parkinson's. Gait has deteriorated per pt's son. Ambulates w/o assistive device at present. Imaging performed in ED demonstrates, "Acute nondisplaced appearing fractures of the left inferior and superior pubic ramus, the superior pubic ramus fracture at the junction of the acetabulum and pubic ramus. Minimally displaced anterior wall fracture of the left acetabulum. No joint dislocation. No proximal femoral fracture." Pt comfortable at time of assessment. (+) L hip pain, decreased AROM/PROM. Pain ctrl. Non-weight bearing. Orthopedic Surgery consult. Mgmt likely to be non-operative. Fall precautions. P/T eval. I expect pt may need to walk w/ assistive device in future, w/ goal of preventing falls, given pt's underlying Parkinson's disease, the natural history/course of which will likely result in further deterioration of gait and balance.  2.) Dehydration/Cr elevation: Dry MM, endorses thirst. BUN 27, Cr 1.13, ratio ~23.9. Per prior Cardiology documentation (Office visit 11/05/2017, Dr. Ubaldo Glassing), pt w/, "preserved LV function with no structural or valvular abnormalities." IVF LR x1L. Previous Cr 0.88 (09/19/2017), baseline suspected to be 0.8-1.0 based on review of prior labwork. Does not appear to be in AKI. 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 Monitor BMP, avoid nephrotoxins.  3.) Hyperglycemia/T2NIDDM: Glucose 238. MSSI. Hold Metformin, Januvia. Last HbA1c 8.0 (11/04/2017), (+) microalbuminuria, uncontrolled T2NIDDM w/ hyperglycemia + nephropathy.  4.) Leukocytosis: WBC 17.1. Tachycardic, SIRS (+). Denies urinary symptoms. U/A pending. (-) tachypnea/hypoxia, lungs clear. Leukocytosis likely reactive,  2/2 trauma/Fx. Monitor.  5.) Normocytic anemia: Hgb 11.3, MCV 83.2. Suspect anemia of chronic disease. Low suspicion for active/acute bleeding. Monitor.  6.) HTN: c/w Cozaar.  7.) HLD: c/w Pravachol.  8.) GERD: c/w Protonix. Pepcid PRN.  9.) Parkinson's disease: c/w Sinemet, Amantadine.  10.) FEN/GI: Cardiac diabetic diet, IVF, Protonix.  11.) DVT PPx: Lovenox 40mg  SQ qD.  12.) Code status: Full code. Based on conversation w/ pt and son (DPOA), pt has living will but it seems the pt and family have not discussed advance directives/care planning. They indicate they will do so at a later time.  13.) Disposition: Admission, pt expected to stay > 2 midnights.  Addendum: Though pt is likely to be managed non-operatively, in the event that she is considered a surgical candidate, she would be at moderate cardiovascular risk for a moderate-risk non-cardiovascular operative procedure, likely warranting preoperative stress testing. However, I doubt this will be necessary. Pt's son [name] is durable medical power of attorney,  and is reachable at [phone].   All the records are reviewed and case discussed with ED provider. Management plans discussed with the patient, family and they are in agreement.  CODE STATUS: Full code.  TOTAL TIME TAKING CARE OF THIS PATIENT: 90 minutes.    Arta Silence M.D on 04/11/2018 at 11:59 PM  Between 7am to 6pm - Pager - [phone]  After 6pm go to [email or web address] - Proofreader  [facility]  Office  [phone]  CC: Primary care physician; Valera Castle, MD   Note: This dictation was prepared with Dragon dictation along with smaller phrase technology. Any transcriptional errors that result from this process are unintentional.    my naem is dog haev special trick i sniff buzz boyes tell if dey sicc  my hoom make me protecctiv soot so angreyflies cant sting my snoot

## 2018-04-11 NOTE — ED Notes (Signed)
Pt states she fell on hardwood this pm injuring left hip. Pt states "my knee just gave out". Pt denies hitting head or neck pain. Cms intact to all extremities, however pt with pain on movement of left hip.

## 2018-04-11 NOTE — ED Provider Notes (Signed)
[facility] Emergency Department Provider Note  ____________________________________________   I have reviewed the triage vital signs and the nursing notes.   HISTORY  Chief Complaint Hip Pain   History limited by: Not Limited   HPI Cathy Barton is a 84 y.o. female who presents to the emergency department today because of concerns for left hip pain.  She states she was walking in her house when her left leg went out.  She states that she does on occasion have episodes where her legs will become weak.  She states that she fell onto her left side onto her hard floor.  Since that time she has had left hip pain.  She denies hitting her head or any loss of consciousness.  She denies any neck pain.   Per medical record review patient has a history of DM, HLD, HTN.  Past Medical History:  Diagnosis Date  . Diabetes mellitus without complication ([place])   . Hyperlipidemia   . Hypertension   . Meningioma ([place])   . Parkinson's disease ([place])    Followed by Neurology.    Patient Active Problem List   Diagnosis Date Noted  . Abnormal finding on EKG 09/18/2017  . URI with cough and congestion 01/10/2017  . Uncontrolled diabetes mellitus without complication ([place]) 16/09/9603  . Hypertension 03/06/2016  . Parkinson's disease ([place]) 03/06/2016  . Hyperlipidemia 03/06/2016    Past Surgical History:  Procedure Laterality Date  . ABDOMINAL HYSTERECTOMY    . APPENDECTOMY    . CHOLECYSTECTOMY    . SHOULDER SURGERY      Prior to Admission medications   Medication Sig Start Date End Date Taking? Authorizing Provider  Amantadine HCl 100 MG tablet  10/15/17  Yes [provider]  aspirin 81 MG tablet Take 81 mg by mouth daily.    Yes [provider]  carbidopa-levodopa (SINEMET IR) 25-100 MG tablet Take 2 tablets by mouth 3 (three) times daily. 09/10/17  Yes [provider]  ibuprofen (ADVIL,MOTRIN) 200 MG tablet Take 1 tablet by mouth  every 6 (six) hours as needed.   Yes [provider]  losartan (COZAAR) 50 MG tablet TAKE 1 TABLET(50 MG) BY MOUTH DAILY 01/01/18  Yes Keith Rake Asad A, MD  metFORMIN (GLUCOPHAGE) 500 MG tablet TAKE 1 TABLET(500 MG) BY MOUTH TWICE DAILY WITH A MEAL 11/04/17  Yes Keith Rake Asad A, MD  pantoprazole (PROTONIX) 20 MG tablet TAKE 1 TABLET(20 MG) BY MOUTH DAILY 01/16/18  Yes Roselee Nova, MD  pravastatin (PRAVACHOL) 40 MG tablet Take 1 tablet (40 mg total) by mouth at bedtime. 02/09/18  Yes Lada, Satira Anis, MD  ranitidine (ZANTAC) 150 MG capsule Take 1 capsule by mouth daily as needed.   Yes [provider]  sitaGLIPtin (JANUVIA) 100 MG tablet Take 1 tablet (100 mg total) by mouth daily. 11/04/17  Yes Roselee Nova, MD    Allergies Codeine  Family History  Problem Relation Age of Onset  . Breast cancer Mother 43  . Heart disease Father   . Healthy Sister   . Cancer Brother        Colon CA  . Kidney failure Brother   . Cancer Brother        Prostate CA  . Kidney failure Brother   . Kidney failure Sister   . Kidney failure Sister     Social History Social History   Tobacco Use  . Smoking status: Former Smoker    Packs/day:  0.25    Years: 3.00    Pack years: 0.75    Types: Cigarettes  . Smokeless tobacco: Never Used  . Tobacco comment: for a short time period  Substance Use Topics  . Alcohol use: No    Alcohol/week: 0.0 oz  . Drug use: No    Review of Systems Constitutional: No fever/chills Eyes: No visual changes. ENT: No sore throat. Cardiovascular: Denies chest pain. Respiratory: Denies shortness of breath. Gastrointestinal: No abdominal pain.  No nausea, no vomiting.  No diarrhea.   Genitourinary: Negative for dysuria. Musculoskeletal: Positive for left hip pain. Skin: Negative for rash. Neurological: Negative for headaches, focal weakness or numbness.  ____________________________________________   PHYSICAL EXAM:  VITAL SIGNS: ED  Triage Vitals  Enc Vitals Group     BP 04/11/18 2026 (!) 174/85     Pulse Rate 04/11/18 2026 (!) 104     Resp 04/11/18 2026 18     Temp 04/11/18 2026 98.5 F (36.9 C)     Temp Source 04/11/18 2026 Oral     SpO2 04/11/18 2026 98 %     Weight 04/11/18 2027 141 lb (64 kg)     Height 04/11/18 2027 5\' 2"  (1.575 m)     Head Circumference --      Peak Flow --      Pain Score 04/11/18 2026 10   Constitutional: Alert and oriented. Well appearing and in no distress. Eyes: Conjunctivae are normal.  ENT   Head: Normocephalic and atraumatic.   Nose: No congestion/rhinnorhea.   Mouth/Throat: Mucous membranes are moist.   Neck: No stridor. Hematological/Lymphatic/Immunilogical: No cervical lymphadenopathy. Cardiovascular: Normal rate, regular rhythm.  No murmurs, rubs, or gallops.  Respiratory: Normal respiratory effort without tachypnea nor retractions. Breath sounds are clear and equal bilaterally. No wheezes/rales/rhonchi. Gastrointestinal: Soft and non tender. No rebound. No guarding.  Genitourinary: Deferred Musculoskeletal: Left hip tender to palpation and manipulation. No obvious deformity or shortening or rotation to the hip. Neurologic:  Normal speech and language. No gross focal neurologic deficits are appreciated.  Skin:  Skin is warm, dry and intact. No rash noted. Psychiatric: Mood and affect are normal. Speech and behavior are normal. Patient exhibits appropriate insight and judgment.  ____________________________________________    LABS (pertinent positives/negatives)  CBC wbc 17.1, hgb 11.3, plt 175 BMP na 135, k 4.1, glu 238, cr 1.13  ____________________________________________   EKG  None  ____________________________________________    RADIOLOGY  Left hip X-ray No acute fracture dislocation  CT left hip Positive for pubic rami fractures on the left  side ____________________________________________   PROCEDURES  Procedures  ____________________________________________   INITIAL IMPRESSION / ASSESSMENT AND PLAN / ED COURSE  Pertinent labs & imaging results that were available during my care of the patient were reviewed by me and considered in my medical decision making (see chart for details).  Patient presented to the emergency department today because of concerns for left hip pain after fall.  X-rays were negative however patient did have significant tenderness to manipulation.  CT scan was performed which did show pelvic fractures.  Discussed this finding with the patient.  Will plan on admission to hospital service.  ____________________________________________   FINAL CLINICAL IMPRESSION(S) / ED DIAGNOSES  Final diagnoses:  Closed nondisplaced fracture of pelvis, unspecified part of pelvis, initial encounter [facility]     Note: This dictation was prepared with Dragon dictation. Any transcriptional errors that result from this process are unintentional     Nance Pear, MD 04/11/18 2345

## 2018-04-12 ENCOUNTER — Other Ambulatory Visit: Payer: Self-pay

## 2018-04-12 DIAGNOSIS — S329XXA Fracture of unspecified parts of lumbosacral spine and pelvis, initial encounter for closed fracture: Secondary | ICD-10-CM | POA: Diagnosis not present

## 2018-04-12 DIAGNOSIS — S32512A Fracture of superior rim of left pubis, initial encounter for closed fracture: Secondary | ICD-10-CM | POA: Diagnosis not present

## 2018-04-12 LAB — GLUCOSE, CAPILLARY
GLUCOSE-CAPILLARY: 118 mg/dL — AB (ref 65–99)
GLUCOSE-CAPILLARY: 160 mg/dL — AB (ref 65–99)
Glucose-Capillary: 130 mg/dL — ABNORMAL HIGH (ref 65–99)
Glucose-Capillary: 152 mg/dL — ABNORMAL HIGH (ref 65–99)

## 2018-04-12 MED ORDER — ONDANSETRON HCL 4 MG/2ML IJ SOLN: 4.0000 mg | Freq: Four times a day (QID) | INTRAMUSCULAR | Status: DC | PRN

## 2018-04-12 MED ORDER — BISACODYL 5 MG PO TBEC: 5.0000 mg | DELAYED_RELEASE_TABLET | Freq: Every day | ORAL | Status: DC | PRN

## 2018-04-12 MED ORDER — HYDROCODONE-ACETAMINOPHEN 5-325 MG PO TABS
1.0000 | ORAL_TABLET | ORAL | Status: DC | PRN
  Administered 2018-04-12 – 2018-04-13 (×6): 1 via ORAL
  Filled 2018-04-12 (×6): qty 1

## 2018-04-12 MED ORDER — ASPIRIN EC 81 MG PO TBEC
81.0000 mg | DELAYED_RELEASE_TABLET | Freq: Every day | ORAL | Status: DC
  Administered 2018-04-12 – 2018-04-13 (×2): 81 mg via ORAL
  Filled 2018-04-12 (×2): qty 1

## 2018-04-12 MED ORDER — PRAVASTATIN SODIUM 20 MG PO TABS
40.0000 mg | ORAL_TABLET | Freq: Every day | ORAL | Status: DC
  Administered 2018-04-12 (×2): 40 mg via ORAL
  Filled 2018-04-12 (×2): qty 2

## 2018-04-12 MED ORDER — SENNOSIDES-DOCUSATE SODIUM 8.6-50 MG PO TABS: 1.0000 | ORAL_TABLET | Freq: Every evening | ORAL | Status: DC | PRN

## 2018-04-12 MED ORDER — FAMOTIDINE 20 MG PO TABS: 20.0000 mg | ORAL_TABLET | Freq: Every day | ORAL | Status: DC | PRN

## 2018-04-12 MED ORDER — LACTATED RINGERS IV SOLN
Freq: Once | INTRAVENOUS | Status: AC
  Administered 2018-04-12: 12:00:00 via INTRAVENOUS

## 2018-04-12 MED ORDER — LINAGLIPTIN 5 MG PO TABS
5.0000 mg | ORAL_TABLET | Freq: Every day | ORAL | Status: DC
  Administered 2018-04-12 – 2018-04-13 (×2): 5 mg via ORAL
  Filled 2018-04-12 (×2): qty 1

## 2018-04-12 MED ORDER — PANTOPRAZOLE SODIUM 40 MG PO PACK
20.0000 mg | PACK | Freq: Every day | ORAL | Status: DC
Start: 2018-04-12 — End: 2018-04-13
  Administered 2018-04-12 – 2018-04-13 (×2): 20 mg via ORAL
  Filled 2018-04-12 (×2): qty 20

## 2018-04-12 MED ORDER — CARBIDOPA-LEVODOPA 25-100 MG PO TABS
2.0000 | ORAL_TABLET | Freq: Three times a day (TID) | ORAL | Status: DC
  Administered 2018-04-12 – 2018-04-13 (×4): 2 via ORAL
  Filled 2018-04-12 (×6): qty 2

## 2018-04-12 MED ORDER — HYDROCODONE-ACETAMINOPHEN 10-325 MG PO TABS: 1.0000 | ORAL_TABLET | ORAL | Status: DC | PRN

## 2018-04-12 MED ORDER — LOSARTAN POTASSIUM 50 MG PO TABS
50.0000 mg | ORAL_TABLET | Freq: Every day | ORAL | Status: DC
  Administered 2018-04-12 – 2018-04-13 (×2): 50 mg via ORAL
  Filled 2018-04-12 (×2): qty 1

## 2018-04-12 MED ORDER — ENOXAPARIN SODIUM 40 MG/0.4ML ~~LOC~~ SOLN
40.0000 mg | SUBCUTANEOUS | Status: DC
  Administered 2018-04-12 – 2018-04-13 (×2): 40 mg via SUBCUTANEOUS
  Filled 2018-04-12 (×2): qty 0.4

## 2018-04-12 MED ORDER — INSULIN ASPART 100 UNIT/ML ~~LOC~~ SOLN
0.0000 [IU] | Freq: Three times a day (TID) | SUBCUTANEOUS | Status: DC
  Administered 2018-04-12: 3 [IU] via SUBCUTANEOUS
  Administered 2018-04-12: 2 [IU] via SUBCUTANEOUS
  Administered 2018-04-13: 3 [IU] via SUBCUTANEOUS
  Filled 2018-04-12 (×3): qty 1

## 2018-04-12 MED ORDER — PANTOPRAZOLE SODIUM 20 MG PO TBEC: 20.0000 mg | DELAYED_RELEASE_TABLET | Freq: Every day | ORAL | Status: DC

## 2018-04-12 MED ORDER — AMANTADINE HCL 100 MG PO CAPS
100.0000 mg | ORAL_CAPSULE | Freq: Two times a day (BID) | ORAL | Status: DC
  Administered 2018-04-12 – 2018-04-13 (×3): 100 mg via ORAL
  Filled 2018-04-12 (×5): qty 1

## 2018-04-12 MED ORDER — ACETAMINOPHEN 325 MG PO TABS: 650.0000 mg | ORAL_TABLET | Freq: Four times a day (QID) | ORAL | Status: DC | PRN

## 2018-04-12 MED ORDER — LACTATED RINGERS IV SOLN
INTRAVENOUS | Status: DC
  Administered 2018-04-12: 01:00:00 via INTRAVENOUS

## 2018-04-12 NOTE — Consult Note (Signed)
ORTHOPAEDIC CONSULTATION  REQUESTING PHYSICIAN: Fritzi Mandes, MD  Chief Complaint: hip pain  HPI: Cathy Barton is a 84 y.o. female who complains of left sided hip pain. She had a mechanical fall. Please see the H&P and ED notes for details. She denies any numbness, tingling or constitutional symptoms.  Past Medical History:  Diagnosis Date  . Diabetes mellitus without complication ([place])   . Hyperlipidemia   . Hypertension   . Meningioma ([place])   . Parkinson's disease ([place])    Followed by Neurology.   Past Surgical History:  Procedure Laterality Date  . ABDOMINAL HYSTERECTOMY    . APPENDECTOMY    . CHOLECYSTECTOMY    . SHOULDER SURGERY     Social History   Socioeconomic History  . Marital status: Widowed    Spouse name: Not on file  . Number of children: 2  . Years of education: Not on file  . Highest education level: Not on file  Occupational History  . Occupation: Retired  Scientific laboratory technician  . Financial resource strain: Not hard at all  . Food insecurity:    Worry: Never true    Inability: Never true  . Transportation needs:    Medical: No    Non-medical: No  Tobacco Use  . Smoking status: Former Smoker    Packs/day: 0.25    Years: 3.00    Pack years: 0.75    Types: Cigarettes  . Smokeless tobacco: Never Used  . Tobacco comment: for a short time period  Substance and Sexual Activity  . Alcohol use: No    Alcohol/week: 0.0 oz  . Drug use: No  . Sexual activity: Never  Lifestyle  . Physical activity:    Days per week: 0 days    Minutes per session: 0 min  . Stress: Not at all  Relationships  . Social connections:    Talks on phone: More than three times a week    Gets together: More than three times a week    Attends religious service: More than 4 times per year    Active member of club or organization: Yes    Attends meetings of clubs or organizations: More than 4 times per year    Relationship status: Widowed  Other Topics Concern  . Not on file   Social History Narrative  . Not on file   Family History  Problem Relation Age of Onset  . Breast cancer Mother 45  . Heart disease Father   . Healthy Sister   . Cancer Brother        Colon CA  . Kidney failure Brother   . Cancer Brother        Prostate CA  . Kidney failure Brother   . Kidney failure Sister   . Kidney failure Sister    Allergies  Allergen Reactions  . Codeine Other (See Comments)   Prior to Admission medications   Medication Sig Start Date End Date Taking? Authorizing Provider  Amantadine HCl 100 MG tablet  10/15/17  Yes [provider]  aspirin 81 MG tablet Take 81 mg by mouth daily.    Yes [provider]  carbidopa-levodopa (SINEMET IR) 25-100 MG tablet Take 2 tablets by mouth 3 (three) times daily. 09/10/17  Yes [provider]  ibuprofen (ADVIL,MOTRIN) 200 MG tablet Take 1 tablet by mouth every 6 (six) hours as needed.   Yes [provider]  losartan (COZAAR) 50 MG tablet TAKE 1 TABLET(50 MG) BY MOUTH DAILY 01/01/18  Yes Keith Rake Asad A, MD  metFORMIN (GLUCOPHAGE) 500 MG tablet TAKE 1 TABLET(500 MG) BY MOUTH TWICE DAILY WITH A MEAL 11/04/17  Yes Keith Rake Asad A, MD  pantoprazole (PROTONIX) 20 MG tablet TAKE 1 TABLET(20 MG) BY MOUTH DAILY 01/16/18  Yes Roselee Nova, MD  pravastatin (PRAVACHOL) 40 MG tablet Take 1 tablet (40 mg total) by mouth at bedtime. 02/09/18  Yes Lada, Satira Anis, MD  ranitidine (ZANTAC) 150 MG capsule Take 1 capsule by mouth daily as needed.   Yes [provider]  sitaGLIPtin (JANUVIA) 100 MG tablet Take 1 tablet (100 mg total) by mouth daily. 11/04/17  Yes Roselee Nova, MD   Ct Hip Left Wo Contrast  Result Date: 04/11/2018 CLINICAL DATA:  Left hip pain after fall. EXAM: CT OF THE LEFT HIP WITHOUT CONTRAST TECHNIQUE: Multidetector CT imaging of the left hip was performed according to the standard protocol. Multiplanar CT image reconstructions were also generated. COMPARISON:   04/11/2018 radiographs of the left hip FINDINGS: Bones/Joint/Cartilage Acute appearing left anterior wall fracture of the acetabulum without significant displacement, series 3/24. Trace joint effusion. Acute minimally displaced left inferior pubic ramus fracture with fracture at the junction of the left superior pubic ramus and acetabulum, series 3/42 and series 5/24. No hip joint dislocation. Enthesopathy of the gluteal muscle attachments of the greater trochanter. Capsular calcifications about the left hip. No proximal femoral fracture. Ligaments Suboptimally assessed on CT. Muscles and Tendons No muscle atrophy. No intramuscular hemorrhage. Soft tissues No subcutaneous hematoma.  No significant joint effusion. IMPRESSION: 1. Acute nondisplaced appearing fractures of the left inferior and superior pubic ramus, the superior pubic ramus fracture at the junction of the acetabulum and pubic ramus. 2. Minimally displaced anterior wall fracture of the left acetabulum. 3. No joint dislocation.  No proximal femoral fracture. Electronically Signed   By: Ashley Royalty M.D.   On: 04/11/2018 22:24   Dg Hip Unilat With Pelvis 2-3 Views Left  Result Date: 04/11/2018 CLINICAL DATA:  84 year old female status post fall at home on the left side today. Subsequent painful weight-bearing. EXAM: DG HIP (WITH OR WITHOUT PELVIS) 2-3V LEFT COMPARISON:  None. FINDINGS: The femoral heads are normally located. The hip joint spaces appear symmetric. No pelvis fracture identified. Heterotopic ossification about both greater trochanters, more bulky on the right. Bilateral femoral head and acetabular osteophytosis. Grossly intact proximal right femur. No proximal left femur fracture identified. Superimposed iliofemoral calcified atherosclerosis. Negative visible bowel gas pattern. IMPRESSION: No acute fracture or dislocation identified about the left hip or pelvis. If occult hip fracture is suspected or if the patient is unable to weightbear,  MRI is the preferred modality for further evaluation. Electronically Signed   By: Genevie Ann M.D.   On: 04/11/2018 21:12    Positive ROS: All other systems have been reviewed and were otherwise negative with the exception of those mentioned in the HPI and as above.  Physical Exam: General: Alert, no acute distress Cardiovascular: No pedal edema Respiratory: No cyanosis, no use of accessory musculature GI: No organomegaly, abdomen is soft and non-tender Skin: No lesions in the area of chief complaint Neurologic: Sensation intact distally Psychiatric: Patient is competent for consent with normal mood and affect Lymphatic: No axillary or cervical lymphadenopathy  MUSCULOSKELETAL: LLE: pain with IR/ER, DF/PF/EHL intact, good cap refill BUE: FROM and non-tender  Assessment: Left sided rami fractures and anterior wall acetabular fracture  Plan: These fractures do not require surgical intervention. She may  be partial weight bearing 50% on the left and full weight bearing on the right. Physical therapy may work with the patient and she may follow-up with me in 2 - 3 weeks for repeat check and x-ray.    Lovell Sheehan, MD    04/12/2018 10:40 AM

## 2018-04-12 NOTE — Progress Notes (Signed)
[facility] [place] NAME: Cathy Barton    MR#:  [id number]  DATE OF BIRTH:  1930-02-26  SUBJECTIVE:   Patient came in after mechanical fall at home. Denies any complaints. Eating breakfast. Son in the room. She states her pain is much under control now. REVIEW OF SYSTEMS:   Review of Systems  Constitutional: Negative for chills, fever and weight loss.  HENT: Negative for ear discharge, ear pain and nosebleeds.   Eyes: Negative for blurred vision, pain and discharge.  Respiratory: Negative for sputum production, shortness of breath, wheezing and stridor.   Cardiovascular: Negative for chest pain, palpitations, orthopnea and PND.  Gastrointestinal: Negative for abdominal pain, diarrhea, nausea and vomiting.  Genitourinary: Negative for frequency and urgency.  Musculoskeletal: Positive for falls and joint pain. Negative for back pain.  Neurological: Negative for sensory change, speech change, focal weakness and weakness.  Psychiatric/Behavioral: Negative for depression and hallucinations. The patient is not nervous/anxious.    Tolerating Diet:yes Tolerating PT: pending  DRUG ALLERGIES:   Allergies  Allergen Reactions  . Codeine Other (See Comments)    VITALS:  Blood pressure 127/72, pulse 90, temperature 98.6 F (37 C), temperature source Oral, resp. rate 18, height 5\' 2"  (1.575 m), weight 65 kg (143 lb 6.4 oz), SpO2 96 %.  PHYSICAL EXAMINATION:   Physical Exam  GENERAL:  84 y.o.-year-old patient lying in the bed with no acute distress.  EYES: Pupils equal, round, reactive to light and accommodation. No scleral icterus. Extraocular muscles intact.  HEENT: Head atraumatic, normocephalic. Oropharynx and nasopharynx clear.  NECK:  Supple, no jugular venous distention. No thyroid enlargement, no tenderness.  LUNGS: Normal breath sounds bilaterally, no wheezing, rales, rhonchi. No use of accessory muscles of respiration.   CARDIOVASCULAR: S1, S2 normal. No murmurs, rubs, or gallops.  ABDOMEN: Soft, nontender, nondistended. Bowel sounds present. No organomegaly or mass.  EXTREMITIES: No cyanosis, clubbing or edema b/l.   Restricted range of motion of left leg secondary to pelvic fracture NEUROLOGIC: Cranial nerves II through XII are intact. No focal Motor or sensory deficits b/l.   PSYCHIATRIC:  patient is alert and oriented x 3.  SKIN: No obvious rash, lesion, or ulcer.   LABORATORY PANEL:  CBC Recent Labs  Lab 04/11/18 2314  WBC 17.1*  HGB 11.3*  HCT 33.1*  PLT 175    Chemistries  Recent Labs  Lab 04/11/18 2314  NA 135  K 4.1  CL 106  CO2 22  GLUCOSE 238*  BUN 27*  CREATININE 1.13*  CALCIUM 9.6   Cardiac Enzymes No results for input(s): TROPONINI in the last 168 hours. RADIOLOGY:  Ct Hip Left Wo Contrast  Result Date: 04/11/2018 CLINICAL DATA:  Left hip pain after fall. EXAM: CT OF THE LEFT HIP WITHOUT CONTRAST TECHNIQUE: Multidetector CT imaging of the left hip was performed according to the standard protocol. Multiplanar CT image reconstructions were also generated. COMPARISON:  04/11/2018 radiographs of the left hip FINDINGS: Bones/Joint/Cartilage Acute appearing left anterior wall fracture of the acetabulum without significant displacement, series 3/24. Trace joint effusion. Acute minimally displaced left inferior pubic ramus fracture with fracture at the junction of the left superior pubic ramus and acetabulum, series 3/42 and series 5/24. No hip joint dislocation. Enthesopathy of the gluteal muscle attachments of the greater trochanter. Capsular calcifications about the left hip. No proximal femoral fracture. Ligaments Suboptimally assessed on CT. Muscles and Tendons No muscle atrophy. No intramuscular hemorrhage. Soft tissues No subcutaneous  hematoma.  No significant joint effusion. IMPRESSION: 1. Acute nondisplaced appearing fractures of the left inferior and superior pubic ramus, the  superior pubic ramus fracture at the junction of the acetabulum and pubic ramus. 2. Minimally displaced anterior wall fracture of the left acetabulum. 3. No joint dislocation.  No proximal femoral fracture. Electronically Signed   By: Ashley Royalty M.D.   On: 04/11/2018 22:24   Dg Hip Unilat With Pelvis 2-3 Views Left  Result Date: 04/11/2018 CLINICAL DATA:  84 year old female status post fall at home on the left side today. Subsequent painful weight-bearing. EXAM: DG HIP (WITH OR WITHOUT PELVIS) 2-3V LEFT COMPARISON:  None. FINDINGS: The femoral heads are normally located. The hip joint spaces appear symmetric. No pelvis fracture identified. Heterotopic ossification about both greater trochanters, more bulky on the right. Bilateral femoral head and acetabular osteophytosis. Grossly intact proximal right femur. No proximal left femur fracture identified. Superimposed iliofemoral calcified atherosclerosis. Negative visible bowel gas pattern. IMPRESSION: No acute fracture or dislocation identified about the left hip or pelvis. If occult hip fracture is suspected or if the patient is unable to weightbear, MRI is the preferred modality for further evaluation. Electronically Signed   By: Genevie Ann M.D.   On: 04/11/2018 21:12   ASSESSMENT AND PLAN:  Cathy Barton is a 84 y.o. female who presents to the emergency department today because of concerns for left hip pain.  She states she was walking in her house when her left leg went out.  She states that she does on occasion have episodes where her legs will become weak.  1.Acute nondisplaced appearing fractures of the left infesuperior pubic ramus, the superior pubic ramus fracture at the junction of the acetabulum due to mechanical fall at home. -Patient has Parkinson's disease. She occasionally uses cane. Lately feeling imbalance how were not using her cane as she is supposed to. Had a mechanical fall and now has left hip pain which is better with pain  control. -Orthopedic consultation pending -PRN pain medicine  2. type II diabetes -sliding scale insulin and tradjenta -holding metformin due to elevated creatinine  3. Mild renal insufficiency -IV fluids today  4. Parkinson's disease continue home meds.  -pt follows with neurology Dr. Manuella Ghazi  5.GERD pn ppi  6. D/w pt and son D/c planning to home with HHPT  (awaiting PT consult)  Case discussed with Care Management/Social Worker. Management plans discussed with the patient, family and they are in agreement.  CODE STATUS: *full*  DVT Prophylaxis: lovenox  TOTAL TIME TAKING CARE OF THIS PATIENT: *30* minutes.  >50% time spent on counselling and coordination of care  POSSIBLE D/C IN 1-2 DAYS, DEPENDING ON CLINICAL CONDITION.  Note: This dictation was prepared with Dragon dictation along with smaller phrase technology. Any transcriptional errors that result from this process are unintentional.  Fritzi Mandes M.D on 04/12/2018 at 9:54 AM  Between 7am to 6pm - Pager - [phone]  After 6pm go to [email or web address] - password [email or web address] [facility]  Office  [phone]  CC: Primary care physician; Valera Castle, MDPatient ID: Verita Schneiders, female   DOB: [birth year], 84 y.o.   MRN: [id number]

## 2018-04-13 DIAGNOSIS — S32512A Fracture of superior rim of left pubis, initial encounter for closed fracture: Secondary | ICD-10-CM | POA: Diagnosis not present

## 2018-04-13 DIAGNOSIS — S329XXA Fracture of unspecified parts of lumbosacral spine and pelvis, initial encounter for closed fracture: Secondary | ICD-10-CM | POA: Diagnosis not present

## 2018-04-13 LAB — GLUCOSE, CAPILLARY
Glucose-Capillary: 120 mg/dL — ABNORMAL HIGH (ref 65–99)
Glucose-Capillary: 170 mg/dL — ABNORMAL HIGH (ref 65–99)

## 2018-04-13 LAB — CREATININE, SERUM
Creatinine, Ser: 1.07 mg/dL — ABNORMAL HIGH (ref 0.44–1.00)
GFR calc non Af Amer: 46 mL/min — ABNORMAL LOW (ref >60)
GFR, EST AFRICAN AMERICAN: 53 mL/min — AB (ref >60)

## 2018-04-13 MED ORDER — HYDROCODONE-ACETAMINOPHEN 10-325 MG PO TABS: 1.0000 | ORAL_TABLET | Freq: Four times a day (QID) | ORAL | 0 refills | Status: DC | PRN

## 2018-04-13 NOTE — Plan of Care (Signed)
Patient rested during the night. Son at the bedside. Medicated x2 for pain with good relief. External catheter in place. Free from injury and falls.

## 2018-04-13 NOTE — Evaluation (Signed)
Occupational Therapy Evaluation Patient Details Name: Cathy Barton MRN: [id number] DOB: 27-Apr-1930 Today's Date: 04/13/2018    History of Present Illness Pt is an 84 y.o. female presenting to hospital 04/11/18 s/p fall (knee gave out).  Imaging showing acute nondisplaced fx's L inferior and superior pubic ramus; the superior pubic ramus fx at junction of acteabulum and pubic ramus; minimally displaced anterior wall fx of L acetabulum.  PMH includes Parkinson's disease, DM, htn, L frontal meningioma, h/o shoulder fx.   Clinical Impression   Pt seen for OT evaluation this date. Prior to hospital admission, pt was generally independent, her son who lives next door has been staying with pt overnight and there during the day as needed. Pt has been using a SPC occasionally when going to the store, otherwise no AD and indep with ADL tasks. Pt reports no other falls in past 12 months but prior to that had a couple falls resulting in RUE and LUE fractures. Currently pt demonstrates impairments in strength, balance, activity tolerance, and requires mod-max assist for LB ADL and mobility.  Pt would benefit from skilled OT to address noted impairments and functional limitations (see below for any additional details) in order to maximize safety and independence while minimizing falls risk and caregiver burden.  Upon hospital discharge, recommend pt discharge to [place].    Follow Up Recommendations  SNF    Equipment Recommendations  3 in 1 bedside commode    Recommendations for Other Services       Precautions / Restrictions Precautions Precautions: Fall Restrictions Weight Bearing Restrictions: Yes RLE Weight Bearing: Weight bearing as tolerated LLE Weight Bearing: Partial weight bearing LLE Partial Weight Bearing Percentage or Pounds: 50%      Mobility Bed Mobility               General bed mobility comments: deferred, up in recliner  Transfers Overall transfer level: Needs  assistance Equipment used: Rolling walker (2 wheeled) Transfers: Sit to/from Stand Sit to Stand: Mod assist;Max assist              Balance Overall balance assessment: Needs assistance Sitting-balance support: Feet supported;No upper extremity supported Sitting balance-Leahy Scale: Poor                                     ADL either performed or assessed with clinical judgement   ADL Overall ADL's : Needs assistance/impaired Eating/Feeding: Sitting;Independent   Grooming: Sitting;Independent   Upper Body Bathing: Sitting;Minimal assistance;Moderate assistance   Lower Body Bathing: Sitting/lateral leans;Maximal assistance   Upper Body Dressing : Sitting;Minimal assistance;Moderate assistance   Lower Body Dressing: Sitting/lateral leans;Maximal assistance   Toilet Transfer: Moderate assistance;Maximal assistance;BSC                   Vision Baseline Vision/History: Wears glasses Wears Glasses: At all times Patient Visual Report: No change from baseline       Perception     Praxis      Pertinent Vitals/Pain Pain Assessment: No/denies pain Pain Intervention(s): Premedicated before session;Monitored during session     Hand Dominance Right   Extremity/Trunk Assessment Upper Extremity Assessment Upper Extremity Assessment: Generalized weakness(grossly 4/5 bilaterally, ROM WFL)   Lower Extremity Assessment Lower Extremity Assessment: Defer to PT evaluation;Generalized weakness;RLE deficits/detail;LLE deficits/detail   Cervical / Trunk Assessment Cervical / Trunk Assessment: Normal   Communication Communication Communication: No difficulties   Cognition Arousal/Alertness: Awake/alert Behavior  During Therapy: WFL for tasks assessed/performed Overall Cognitive Status: Within Functional Limits for tasks assessed                                     General Comments       Exercises     Shoulder Instructions       Home Living Family/patient expects to be discharged to:: Private residence Living Arrangements: Children(son has been living with her (his house is also next door)) Available Help at Discharge: Family Type of Home: House Home Access: [place]: One level     Bathroom Shower/Tub: Tub/shower unit(pt reports upcoming [place] for walk in shower)   [place]: Handicapped height     Home Equipment: Environmental consultant - 2 wheels;Cane - single point;Wheelchair - manual;Shower seat;Grab bars - tub/shower;Grab bars - toilet          Prior Functioning/Environment Level of Independence: Independent with assistive device(s)        Comments: Pt typically ambulates without AD but does occasionally use a SPC when going out to the store.  Pt's son stays with pt at night and is around during the day to assist pt as needed.        OT Problem List: Decreased strength;Decreased knowledge of use of DME or AE;Decreased knowledge of precautions;Decreased activity tolerance;Impaired balance (sitting and/or standing)      OT Treatment/Interventions: Self-care/ADL training;Balance training;Therapeutic exercise;Therapeutic activities;DME and/or AE instruction;Patient/family education    OT Goals(Current goals can be found in the care plan section) Acute Rehab OT Goals Patient Stated Goal: to be able to walk again OT Goal Formulation: With patient Time For Goal Achievement: 04/27/18 Potential to Achieve Goals: Good ADL Goals Pt Will Perform Lower Body Dressing: with min assist;with mod assist;sit to/from stand Pt Will Transfer to Toilet: with min assist;ambulating;bedside commode(RW for amb, maintain PWBing LLE 50%)  OT Frequency: Min 1X/week   Barriers to D/C:            Co-evaluation              AM-PAC PT "6 Clicks" Daily Activity     Outcome Measure Help from another person eating meals?: None Help from another person taking care of personal grooming?: None Help  from another person toileting, which includes using toliet, bedpan, or urinal?: A Lot Help from another person bathing (including washing, rinsing, drying)?: A Lot Help from another person to put on and taking off regular upper body clothing?: A Little Help from another person to put on and taking off regular lower body clothing?: A Lot 6 Click Score: 17   End of Session    Activity Tolerance: Patient tolerated treatment well Patient left: in chair;with call bell/phone within reach;with chair alarm set  OT Visit Diagnosis: Other abnormalities of gait and mobility (R26.89);History of falling (Z91.81);Muscle weakness (generalized) (M62.81)                Time: 1497-0263 OT Time Calculation (min): 22 min Charges:  OT General Charges $OT Visit: 1 Visit OT Evaluation $OT Eval Moderate Complexity: 1 Mod  Jeni Salles, MPH, MS, OTR/L ascom [phone] 04/13/18, 4:01 PM

## 2018-04-13 NOTE — NC FL2 (Signed)
[place] LEVEL OF CARE SCREENING TOOL     IDENTIFICATION  Patient Name: Cathy Barton Birthdate: [birth year] Sex: female Admission Date (Current Location): 04/11/2018  [place] and [place] Number:  Engineering geologist and Address:  [facility], [address] [place], [place] [address]      Provider Number: [id number]  Attending Physician Name and Address:  Fritzi Mandes, MD  Relative Name and Phone Number:       Current Level of Care: Hospital Recommended Level of Care: [place] Prior Approval Number:    Date Approved/Denied:   PASRR Number: [id number])  Discharge Plan: SNF    Current Diagnoses: Patient Active Problem List   Diagnosis Date Noted  . Multiple fractures of pelvis without disruption of pelvic ring, initial encounter for closed fracture ([place]) 04/11/2018  . Abnormal finding on EKG 09/18/2017  . URI with cough and congestion 01/10/2017  . Uncontrolled diabetes mellitus without complication ([place]) 32/95/1884  . Hypertension 03/06/2016  . Parkinson's disease ([place]) 03/06/2016  . Hyperlipidemia 03/06/2016    Orientation RESPIRATION BLADDER Height & Weight     Self, Time, Situation, Place  Normal Continent Weight: 143 lb 6.4 oz (65 kg) Height:  5\' 2"  (157.5 cm)  BEHAVIORAL SYMPTOMS/MOOD NEUROLOGICAL BOWEL NUTRITION STATUS      Continent Diet(Diet: Heart Healthy/ Carb Modified. )  AMBULATORY STATUS COMMUNICATION OF NEEDS Skin   Extensive Assist Verbally Normal                       Personal Care Assistance Level of Assistance  Bathing, Feeding, Dressing Bathing Assistance: Limited assistance Feeding assistance: Independent Dressing Assistance: Limited assistance     Functional Limitations Info  Sight, Hearing, Speech Sight Info: Adequate Hearing Info: Adequate Speech Info: Adequate    SPECIAL CARE FACTORS FREQUENCY  PT (By licensed PT), OT (By licensed OT)     PT Frequency: (5) OT  Frequency: (5)            Contractures      Additional Factors Info  Code Status, Allergies Code Status Info: (Full Code. ) Allergies Info: (Codeine. )           Current Medications (04/13/2018):  This is the current hospital active medication list Current Facility-Administered Medications  Medication Dose Route Frequency Provider Last Rate Last Dose  . acetaminophen (TYLENOL) tablet 650 mg  650 mg Oral Q6H PRN Arta Silence, MD      . amantadine (SYMMETREL) capsule 100 mg  100 mg Oral BID Arta Silence, MD   100 mg at 04/13/18 0947  . aspirin EC tablet 81 mg  81 mg Oral Daily Arta Silence, MD   81 mg at 04/13/18 0947  . bisacodyl (DULCOLAX) EC tablet 5 mg  5 mg Oral Daily PRN Arta Silence, MD      . carbidopa-levodopa (SINEMET IR) 25-100 MG per tablet immediate release 2 tablet  2 tablet Oral TID Arta Silence, MD   2 tablet at 04/13/18 0947  . enoxaparin (LOVENOX) injection 40 mg  40 mg Subcutaneous Q24H Arta Silence, MD   40 mg at 04/13/18 0026  . famotidine (PEPCID) tablet 20 mg  20 mg Oral Daily PRN Arta Silence, MD      . HYDROcodone-acetaminophen (NORCO/VICODIN) 5-325 MG per tablet 1 tablet  1 tablet Oral Q4H PRN Arta Silence, MD   1 tablet at 04/13/18 0947   Or  . HYDROcodone-acetaminophen (NORCO) 10-325 MG per tablet 1 tablet  1 tablet Oral Q4H PRN Arta Silence, MD      . insulin aspart (novoLOG) injection 0-15 Units  0-15 Units Subcutaneous TID WC Arta Silence, MD   3 Units at 04/12/18 1213  . linagliptin (TRADJENTA) tablet 5 mg  5 mg Oral Daily Fritzi Mandes, MD   5 mg at 04/13/18 0947  . losartan (COZAAR) tablet 50 mg  50 mg Oral Daily Arta Silence, MD   50 mg at 04/13/18 0947  . ondansetron (ZOFRAN) injection 4 mg  4 mg Intravenous Q6H PRN Arta Silence, MD      . pantoprazole sodium (PROTONIX) 40 mg/20 mL oral suspension 20 mg  20 mg Oral Daily Arta Silence, MD   20 mg at 04/13/18 0952   . pravastatin (PRAVACHOL) tablet 40 mg  40 mg Oral QHS Arta Silence, MD   40 mg at 04/12/18 2128  . senna-docusate (Senokot-S) tablet 1 tablet  1 tablet Oral QHS PRN Arta Silence, MD         Discharge Medications: Please see discharge summary for a list of discharge medications.  Relevant Imaging Results:  Relevant Lab Results:   Additional Information (SSN: [id number])  Doriann Zuch, Veronia Beets, LCSW

## 2018-04-13 NOTE — Progress Notes (Signed)
Spoke with lab staff in urinalysis lab about not being able to print a label for the microscopic urinalysis. Per lab staff temporary label is ok because they print another label anyway. Will send sample down to lab.

## 2018-04-13 NOTE — Progress Notes (Signed)
Report called and given to Perry at [facility]. EMS called for transport. IV removed. Will assist pt in getting dressed and await transfer to [facility].

## 2018-04-13 NOTE — Progress Notes (Signed)
[place] at [place] NAME: Cathy Barton    MR#:  [id number]  DATE OF BIRTH:  12-26-1929  SUBJECTIVE:   Patient came in after mechanical fall at home. Denies any complaints. Eating breakfast. Son in the room. She states her pain is much under control now. REVIEW OF SYSTEMS:   Review of Systems  Constitutional: Negative for chills, fever and weight loss.  HENT: Negative for ear discharge, ear pain and nosebleeds.   Eyes: Negative for blurred vision, pain and discharge.  Respiratory: Negative for sputum production, shortness of breath, wheezing and stridor.   Cardiovascular: Negative for chest pain, palpitations, orthopnea and PND.  Gastrointestinal: Negative for abdominal pain, diarrhea, nausea and vomiting.  Genitourinary: Negative for frequency and urgency.  Musculoskeletal: Positive for falls and joint pain. Negative for back pain.  Neurological: Negative for sensory change, speech change, focal weakness and weakness.  Psychiatric/Behavioral: Negative for depression and hallucinations. The patient is not nervous/anxious.    Tolerating Diet:yes Tolerating PT: yes DRUG ALLERGIES:   Allergies  Allergen Reactions  . Codeine Other (See Comments)    VITALS:  Blood pressure (!) 106/55, pulse 94, temperature 98.9 F (37.2 C), resp. rate 17, height 5\' 2"  (1.575 m), weight 65 kg (143 lb 6.4 oz), SpO2 93 %.  PHYSICAL EXAMINATION:   Physical Exam  GENERAL:  84 y.o.-year-old patient lying in the bed with no acute distress.  EYES: Pupils equal, round, reactive to light and accommodation. No scleral icterus. Extraocular muscles intact.  HEENT: Head atraumatic, normocephalic. Oropharynx and nasopharynx clear.  NECK:  Supple, no jugular venous distention. No thyroid enlargement, no tenderness.  LUNGS: Normal breath sounds bilaterally, no wheezing, rales, rhonchi. No use of accessory muscles of respiration.  CARDIOVASCULAR: S1, S2 normal. No  murmurs, rubs, or gallops.  ABDOMEN: Soft, nontender, nondistended. Bowel sounds present. No organomegaly or mass.  EXTREMITIES: No cyanosis, clubbing or edema b/l.   Restricted range of motion of left leg secondary to pelvic fracture NEUROLOGIC: Cranial nerves II through XII are intact. No focal Motor or sensory deficits b/l.   PSYCHIATRIC:  patient is alert and oriented x 3.  SKIN: No obvious rash, lesion, or ulcer.   LABORATORY PANEL:  CBC Recent Labs  Lab 04/11/18 2314  WBC 17.1*  HGB 11.3*  HCT 33.1*  PLT 175    Chemistries  Recent Labs  Lab 04/11/18 2314  NA 135  K 4.1  CL 106  CO2 22  GLUCOSE 238*  BUN 27*  CREATININE 1.13*  CALCIUM 9.6   Cardiac Enzymes No results for input(s): TROPONINI in the last 168 hours. RADIOLOGY:  Ct Hip Left Wo Contrast  Result Date: 04/11/2018 CLINICAL DATA:  Left hip pain after fall. EXAM: CT OF THE LEFT HIP WITHOUT CONTRAST TECHNIQUE: Multidetector CT imaging of the left hip was performed according to the standard protocol. Multiplanar CT image reconstructions were also generated. COMPARISON:  04/11/2018 radiographs of the left hip FINDINGS: Bones/Joint/Cartilage Acute appearing left anterior wall fracture of the acetabulum without significant displacement, series 3/24. Trace joint effusion. Acute minimally displaced left inferior pubic ramus fracture with fracture at the junction of the left superior pubic ramus and acetabulum, series 3/42 and series 5/24. No hip joint dislocation. Enthesopathy of the gluteal muscle attachments of the greater trochanter. Capsular calcifications about the left hip. No proximal femoral fracture. Ligaments Suboptimally assessed on CT. Muscles and Tendons No muscle atrophy. No intramuscular hemorrhage. Soft tissues No subcutaneous hematoma.  No  significant joint effusion. IMPRESSION: 1. Acute nondisplaced appearing fractures of the left inferior and superior pubic ramus, the superior pubic ramus fracture at the  junction of the acetabulum and pubic ramus. 2. Minimally displaced anterior wall fracture of the left acetabulum. 3. No joint dislocation.  No proximal femoral fracture. Electronically Signed   By: Ashley Royalty M.D.   On: 04/11/2018 22:24   Dg Hip Unilat With Pelvis 2-3 Views Left  Result Date: 04/11/2018 CLINICAL DATA:  84 year old female status post fall at home on the left side today. Subsequent painful weight-bearing. EXAM: DG HIP (WITH OR WITHOUT PELVIS) 2-3V LEFT COMPARISON:  None. FINDINGS: The femoral heads are normally located. The hip joint spaces appear symmetric. No pelvis fracture identified. Heterotopic ossification about both greater trochanters, more bulky on the right. Bilateral femoral head and acetabular osteophytosis. Grossly intact proximal right femur. No proximal left femur fracture identified. Superimposed iliofemoral calcified atherosclerosis. Negative visible bowel gas pattern. IMPRESSION: No acute fracture or dislocation identified about the left hip or pelvis. If occult hip fracture is suspected or if the patient is unable to weightbear, MRI is the preferred modality for further evaluation. Electronically Signed   By: Genevie Ann M.D.   On: 04/11/2018 21:12   ASSESSMENT AND PLAN:  Cathy Barton is a 84 y.o. female who presents to the emergency department today because of concerns for left hip pain.  She states she was walking in her house when her left leg went out.  She states that she does on occasion have episodes where her legs will become weak.  1.Acute nondisplaced appearing fractures of the left infesuperior pubic ramus, the superior pubic ramus fracture at the junction of the acetabulum due to mechanical fall at home. -Patient has Parkinson's disease. She occasionally uses cane.  Had a mechanical fall and now has left hip pain which is better with pain control. -Orthopedic consultation with Dr. Harlow Mares appreciated. Follow-up in 2 to 3 weeks. -PRN pain medicine  2. type  II diabetes -sliding scale insulin and tradjenta -holding metformin due to elevated creatinine  3. Mild renal insufficiency -IV fluids --f/u creat  4. Parkinson's disease continue home meds.  -pt follows with neurology Dr. Manuella Ghazi  5.GERD pn ppi  6. D/w pt and son D/c planning to home with HHPT vs rehab (awaiting PT consult)  Case discussed with Care Management/Social Worker. Management plans discussed with the patient, family and they are in agreement.  CODE STATUS: *full*  DVT Prophylaxis: lovenox  TOTAL TIME TAKING CARE OF THIS PATIENT: *30* minutes.  >50% time spent on counselling and coordination of care  POSSIBLE D/C IN 1-2 DAYS, DEPENDING ON CLINICAL CONDITION.  Note: This dictation was prepared with Dragon dictation along with smaller phrase technology. Any transcriptional errors that result from this process are unintentional.  Fritzi Mandes M.D on 04/13/2018 at 9:30 AM  Between 7am to 6pm - Pager - [phone]  After 6pm go to [email or web address] - password [email or web address] [place] [facility]  Office  [phone]  CC: Primary care physician; Valera Castle, MDPatient ID: Cathy Barton, female   DOB: Nov 15, 1930, 84 y.o.   MRN: [id number]

## 2018-04-13 NOTE — Clinical Social Work Placement (Signed)
   CLINICAL SOCIAL WORK PLACEMENT  NOTE  Date:  04/13/2018  Patient Details  Name: Cathy Barton MRN: [id number] Date of Birth: 1930/10/27  Clinical Social Work is seeking post-discharge placement for this patient at the [place] level of care (*CSW will initial, date and re-position this form in  chart as items are completed):  Yes   Patient/family provided with [facility] Work Department's list of facilities offering this level of care within the geographic area requested by the patient (or if unable, by the patient's family).  Yes   Patient/family informed of their freedom to choose among providers that offer the needed level of care, that participate in Medicare, Medicaid or managed care program needed by the patient, have an available bed and are willing to accept the patient.  Yes   Patient/family informed of [facility]'s ownership interest in [facility] and [facility], as well as of the fact that they are under no obligation to receive care at these facilities.  PASRR submitted to EDS on 04/13/18     PASRR number received on 04/13/18     Existing PASRR number confirmed on       FL2 transmitted to all facilities in geographic area requested by pt/family on 04/13/18     FL2 transmitted to all facilities within larger geographic area on 04/13/18     Patient informed that his/her managed care company has contracts with or will negotiate with certain facilities, including the following:        Yes   Patient/family informed of bed offers received.  Patient chooses bed at [facility] )     Physician recommends and patient chooses bed at      Patient to be transferred to [facility] ) on 04/13/18.  Patient to be transferred to facility by [facility] )     Patient family notified on 04/13/18 of transfer.  Name of family member notified:  (Patient's son [name] is aware of D/C today. )     PHYSICIAN        Additional Comment:    _______________________________________________ Adolf [place], Veronia Beets, LCSW 04/13/2018, 5:57 PM

## 2018-04-13 NOTE — Evaluation (Signed)
Physical Therapy Evaluation Patient Details Name: Cathy Barton MRN: [id number] DOB: September 18, 1930 Today's Date: 04/13/2018   History of Present Illness  Pt is an 84 y.o. female presenting to hospital 04/11/18 s/p fall (knee gave out).  Imaging showing acute nondisplaced fx's L inferior and superior pubic ramus; the superior pubic ramus fx at junction of acteabulum and pubic ramus; minimally displaced anterior wall fx of L acetabulum.  PMH includes Parkinson's disease, DM, htn, L frontal meningioma, h/o shoulder fx.  Clinical Impression  Prior to hospital admission, pt was ambulatory.  Pt lives alone in 1 level home with ramp to enter; pt's son stays with pt at night and is around during the day to assist pt as needed (pt's son reports he is on disability and does not work).  Currently pt is max assist supine to sit; mod to max assist to stand and pivot to R with RW bed to recliner; and pt unable to take any steps forward with R LE (using RW for support) for gait with max cueing and assist.  Mobility complicated d/t L pelvic pain (7/10 at rest and 9/10 with mobility), generalized weakness, and pt requiring increased time to initiate movement (pt with h/o Parkinson's disease).  Pt also requiring cueing for L LE PWB'ing status and how to maintain precautions with functional mobility.  Pt would benefit from skilled PT to address noted impairments and functional limitations (see below for any additional details).  Upon hospital discharge, recommend pt discharge to [place].    Follow Up Recommendations SNF    Equipment Recommendations  Rolling walker with 5" wheels;Wheelchair (measurements PT);Wheelchair cushion (measurements PT)    Recommendations for Other Services OT consult     Precautions / Restrictions Precautions Precautions: Fall Restrictions Weight Bearing Restrictions: Yes RLE Weight Bearing: Weight bearing as tolerated LLE Weight Bearing: Partial weight bearing LLE Partial Weight Bearing  Percentage or Pounds: 50%      Mobility  Bed Mobility Overal bed mobility: Needs Assistance Bed Mobility: Supine to Sit     Supine to sit: HOB elevated;Max assist     General bed mobility comments: assist for trunk and B LE's; vc's for use of bed rail; increased time for pt to perform and initiate movement  Transfers Overall transfer level: Needs assistance Equipment used: Rolling walker (2 wheeled) Transfers: Sit to/from [facility] Sit to Stand: Max assist;Mod assist Stand pivot transfers: Mod assist       General transfer comment: max assist 1st trial standing; mod assist 2nd trial standing; pt requiring significant extra time and cueing to perform stand pivot with RW to R bed to recliner (pt able to step backwards with L LE while turning but pt had difficulting pivoting on R foot); vc's required for UE and LE placement and technique with all transfers  Ambulation/Gait             General Gait Details: pt unable to advance L LE (only able to step backwards with L LE) and unable to advance R LE with max cueing for technique and use of RW (multiple trials of gait attempted)  Stairs            Wheelchair Mobility    Modified Rankin (Stroke Patients Only)       Balance Overall balance assessment: Needs assistance Sitting-balance support: Feet supported;No upper extremity supported Sitting balance-Leahy Scale: Poor Sitting balance - Comments: pt occasionally leaning posterior with no UE support but able to regain balance when putting UE's down on  bed for support Postural control: Posterior lean Standing balance support: Bilateral upper extremity supported Standing balance-Leahy Scale: Poor Standing balance comment: pt requires B UE support on RW for static standing balance (CGA)                             Pertinent Vitals/Pain Pain Assessment: 0-10 Pain Score: 9  Pain Descriptors / Indicators: Aching;Sore Pain Intervention(s):  Limited activity within patient's tolerance;Monitored during session;Repositioned;Patient requesting pain meds-RN notified;RN gave pain meds during session(RN notified of need for more ice for ice pack)  Vitals (HR and O2 on room air) stable and WFL throughout treatment session.    Home Living Family/patient expects to be discharged to:: Private residence Living Arrangements: Children Available Help at Discharge: Family Type of Home: [place] Common: [place]: Environmental consultant - 2 wheels;Cane - single point;Wheelchair - manual;Shower seat;Grab bars - tub/shower;Grab bars - toilet      Prior Function Level of Independence: Independent with assistive device(s)         Comments: Pt typically ambulates without AD.  Pt's son stays with pt at night and is around during the day to assist pt as needed.     Hand Dominance        Extremity/Trunk Assessment   Upper Extremity Assessment Upper Extremity Assessment: Generalized weakness(B UE AROM flexion grossly 80 degrees)    Lower Extremity Assessment Lower Extremity Assessment: RLE deficits/detail;LLE deficits/detail RLE Deficits / Details: hip flexion 3-/5; knee flexion/extension 3-/5 (limited d/t mild R medial knee pain with movement but no c/o pain with WB'ing); DF at least 3/5 AROM LLE Deficits / Details: hip flexion at least 2/5; knee flexion/extension at least 2+/5; DF at least 3/5 AROM; all motions (except ankle ROM) limited d/t L pelvic pain LLE: Unable to fully assess due to pain    Cervical / Trunk Assessment Cervical / Trunk Assessment: Normal  Communication   Communication: No difficulties  Cognition Arousal/Alertness: Awake/alert Behavior During Therapy: WFL for tasks assessed/performed Overall Cognitive Status: Within Functional Limits for tasks assessed                                        General Comments General comments (skin integrity, edema, etc.):  Pt resting in bed upon PT arrival; purewick removed for session (nursing notified that it was removed); pt's son present during session.  Nursing cleared pt for participation in physical therapy.  Pt agreeable to PT session.    Exercises Total Joint Exercises Heel Slides: AAROM;Strengthening;Left;10 reps;Supine  Transfer and gait training   Assessment/Plan    PT Assessment Patient needs continued PT services  PT Problem List Decreased strength;Decreased activity tolerance;Decreased balance;Decreased mobility;Decreased knowledge of use of DME;Decreased knowledge of precautions;Pain       PT Treatment Interventions DME instruction;Gait training;Functional mobility training;Therapeutic activities;Therapeutic exercise;Balance training;Patient/family education    PT Goals (Current goals can be found in the Care Plan section)  Acute Rehab PT Goals Patient Stated Goal: to be able to walk again PT Goal Formulation: With patient Time For Goal Achievement: 04/27/18 Potential to Achieve Goals: Good    Frequency 7X/week   Barriers to discharge Decreased caregiver support      Co-evaluation               AM-PAC PT "6 Clicks" Daily  Activity  Outcome Measure Difficulty turning over in bed (including adjusting bedclothes, sheets and blankets)?: Unable Difficulty moving from lying on back to sitting on the side of the bed? : Unable Difficulty sitting down on and standing up from a chair with arms (e.g., wheelchair, bedside commode, etc,.)?: Unable Help needed moving to and from a bed to chair (including a wheelchair)?: A Little Help needed walking in hospital room?: Total Help needed climbing 3-5 steps with a railing? : Total 6 Click Score: 8    End of Session Equipment Utilized During Treatment: Gait belt Activity Tolerance: Patient limited by pain Patient left: in chair;with call bell/phone within reach;with chair alarm set;with family/visitor present;Other (comment)(B heels  elevated via pillow) Nurse Communication: Mobility status;Precautions;Patient requests pain meds;Weight bearing status PT Visit Diagnosis: Other abnormalities of gait and mobility (R26.89);Muscle weakness (generalized) (M62.81);History of falling (Z91.81);Difficulty in walking, not elsewhere classified (R26.2);Pain Pain - Right/Left: Left Pain - part of body: Hip    Time: 0932-3557 PT Time Calculation (min) (ACUTE ONLY): 37 min   Charges:   PT Evaluation $PT Eval Low Complexity: 1 Low PT Treatments $Gait Training: 8-22 mins $Therapeutic Activity: 8-22 mins   PT G CodesLeitha Bleak, PT 04/13/18, 10:24 AM [phone]

## 2018-04-13 NOTE — Discharge Summary (Signed)
[facility] NAME: Cathy Barton    MR#:  [id number]  DATE OF BIRTH:  06/28/1930  DATE OF ADMISSION:  04/11/2018 ADMITTING PHYSICIAN: Arta Silence, MD  DATE OF DISCHARGE: 04/13/2018  PRIMARY CARE PHYSICIAN: Valera Castle, MD    ADMISSION DIAGNOSIS:  Closed nondisplaced fracture of pelvis, unspecified part of pelvis, initial encounter ([place]) [S32.9XXA]  DISCHARGE DIAGNOSIS:  acute nondisplaced fracture on the left inferior or superior pubic ramus status post mechanical fall mild renal insufficiency SECONDARY DIAGNOSIS:   Past Medical History:  Diagnosis Date  . Diabetes mellitus without complication ([place])   . Hyperlipidemia   . Hypertension   . Meningioma ([place])   . Parkinson's disease ([place])    Followed by Neurology.    HOSPITAL COURSE:   Cathy Barton a 84 y.o.femalewho presents to the emergency department today because of concerns for left hip pain. She states she was walking in her house when her left leg went out.She states that she does on occasion have episodes where her legs will become weak.  1.Acute nondisplaced appearing fractures of the left infesuperior pubic ramus, the superior pubic ramus fracture at the junction of the acetabulum due to mechanical fall at home. -Patient has Parkinson's disease. She occasionally uses cane.  Had a mechanical fall and now has left hip pain which is better with pain control. -Orthopedic consultation with Dr. Harlow Mares appreciated. Follow-up in 2 to 3 weeks. -PRN pain medicine  2. type II diabetes -sliding scale insulin and tradjenta -holding metformin due to elevated creatinine -cont januvia  3. Mild renal insufficiency -IV fluids --f/u creat improved 1.07  4. Parkinson's disease continue home meds.  -pt follows with neurology Dr. Manuella Ghazi  5.GERD pn ppi  6. D/w pt and son D/c planning  Rehab-- patient will go [facility] today.  CONSULTS  OBTAINED:  Treatment Team:  Arta Silence, MD Lovell Sheehan, MD  DRUG ALLERGIES:   Allergies  Allergen Reactions  . Codeine Other (See Comments)    DISCHARGE MEDICATIONS:   Allergies as of 04/13/2018      Reactions   Codeine Other (See Comments)      Medication List    STOP taking these medications   ibuprofen 200 MG tablet Commonly known as:  ADVIL,MOTRIN     TAKE these medications   Amantadine HCl 100 MG tablet   aspirin 81 MG tablet Take 81 mg by mouth daily.   carbidopa-levodopa 25-100 MG tablet Commonly known as:  SINEMET IR Take 2 tablets by mouth 3 (three) times daily.   HYDROcodone-acetaminophen 10-325 MG tablet Commonly known as:  NORCO Take 1 tablet by mouth every 6 (six) hours as needed for severe pain.   losartan 50 MG tablet Commonly known as:  COZAAR TAKE 1 TABLET(50 MG) BY MOUTH DAILY   metFORMIN 500 MG tablet Commonly known as:  GLUCOPHAGE TAKE 1 TABLET(500 MG) BY MOUTH TWICE DAILY WITH A MEAL   pantoprazole 20 MG tablet Commonly known as:  PROTONIX TAKE 1 TABLET(20 MG) BY MOUTH DAILY   pravastatin 40 MG tablet Commonly known as:  PRAVACHOL Take 1 tablet (40 mg total) by mouth at bedtime.   ranitidine 150 MG capsule Commonly known as:  ZANTAC Take 1 capsule by mouth daily as needed.   sitaGLIPtin 100 MG tablet Commonly known as:  JANUVIA Take 1 tablet (100 mg total) by mouth daily.       If you experience worsening of your  admission symptoms, develop shortness of breath, life threatening emergency, suicidal or homicidal thoughts you must seek medical attention immediately by calling 911 or calling your MD immediately  if symptoms less severe.  You Must read complete instructions/literature along with all the possible adverse reactions/side effects for all the Medicines you take and that have been prescribed to you. Take any new Medicines after you have completely understood and accept all the possible adverse reactions/side  effects.   Please note  You were cared for by a hospitalist during your hospital stay. If you have any questions about your discharge medications or the care you received while you were in the hospital after you are discharged, you can call the unit and asked to speak with the hospitalist on call if the hospitalist that took care of you is not available. Once you are discharged, your primary care physician will handle any further medical issues. Please note that NO REFILLS for any discharge medications will be authorized once you are discharged, as it is imperative that you return to your primary care physician (or establish a relationship with a primary care physician if you do not have one) for your aftercare needs so that they can reassess your need for medications and monitor your lab values. Today   SUBJECTIVE   Working with physical therapy. Hip pain improving. Son in the room.  VITAL SIGNS:  Blood pressure (!) 106/55, pulse 94, temperature 98.9 F (37.2 C), resp. rate 17, height 5\' 2"  (1.575 m), weight 65 kg (143 lb 6.4 oz), SpO2 93 %.  I/O:    Intake/Output Summary (Last 24 hours) at 04/13/2018 1548 Last data filed at 04/13/2018 1506 Gross per 24 hour  Intake 530 ml  Output 901 ml  Net -371 ml    PHYSICAL EXAMINATION:  GENERAL:  84 y.o.-year-old patient lying in the bed with no acute distress.  EYES: Pupils equal, round, reactive to light and accommodation. No scleral icterus. Extraocular muscles intact.  HEENT: Head atraumatic, normocephalic. Oropharynx and nasopharynx clear.  NECK:  Supple, no jugular venous distention. No thyroid enlargement, no tenderness.  LUNGS: Normal breath sounds bilaterally, no wheezing, rales,rhonchi or crepitation. No use of accessory muscles of respiration.  CARDIOVASCULAR: S1, S2 normal. No murmurs, rubs, or gallops.  ABDOMEN: Soft, non-tender, non-distended. Bowel sounds present. No organomegaly or mass.  EXTREMITIES: No pedal edema, cyanosis,  or clubbing.  NEUROLOGIC: Cranial nerves II through XII are intact. Muscle strength 5/5 in all extremities. Sensation intact. Gait not checked.  PSYCHIATRIC: The patient is alert and oriented x 3.  SKIN: No obvious rash, lesion, or ulcer.   DATA REVIEW:   CBC  Recent Labs  Lab 04/11/18 2314  WBC 17.1*  HGB 11.3*  HCT 33.1*  PLT 175    Chemistries  Recent Labs  Lab 04/11/18 2314 04/13/18 1038  NA 135  --   K 4.1  --   CL 106  --   CO2 22  --   GLUCOSE 238*  --   BUN 27*  --   CREATININE 1.13* 1.07*  CALCIUM 9.6  --     Microbiology Results   No results found for this or any previous visit (from the past 240 hour(s)).  RADIOLOGY:  Ct Hip Left Wo Contrast  Result Date: 04/11/2018 CLINICAL DATA:  Left hip pain after fall. EXAM: CT OF THE LEFT HIP WITHOUT CONTRAST TECHNIQUE: Multidetector CT imaging of the left hip was performed according to the standard protocol. Multiplanar CT image reconstructions were  also generated. COMPARISON:  04/11/2018 radiographs of the left hip FINDINGS: Bones/Joint/Cartilage Acute appearing left anterior wall fracture of the acetabulum without significant displacement, series 3/24. Trace joint effusion. Acute minimally displaced left inferior pubic ramus fracture with fracture at the junction of the left superior pubic ramus and acetabulum, series 3/42 and series 5/24. No hip joint dislocation. Enthesopathy of the gluteal muscle attachments of the greater trochanter. Capsular calcifications about the left hip. No proximal femoral fracture. Ligaments Suboptimally assessed on CT. Muscles and Tendons No muscle atrophy. No intramuscular hemorrhage. Soft tissues No subcutaneous hematoma.  No significant joint effusion. IMPRESSION: 1. Acute nondisplaced appearing fractures of the left inferior and superior pubic ramus, the superior pubic ramus fracture at the junction of the acetabulum and pubic ramus. 2. Minimally displaced anterior wall fracture of the left  acetabulum. 3. No joint dislocation.  No proximal femoral fracture. Electronically Signed   By: Ashley Royalty M.D.   On: 04/11/2018 22:24   Dg Hip Unilat With Pelvis 2-3 Views Left  Result Date: 04/11/2018 CLINICAL DATA:  84 year old female status post fall at home on the left side today. Subsequent painful weight-bearing. EXAM: DG HIP (WITH OR WITHOUT PELVIS) 2-3V LEFT COMPARISON:  None. FINDINGS: The femoral heads are normally located. The hip joint spaces appear symmetric. No pelvis fracture identified. Heterotopic ossification about both greater trochanters, more bulky on the right. Bilateral femoral head and acetabular osteophytosis. Grossly intact proximal right femur. No proximal left femur fracture identified. Superimposed iliofemoral calcified atherosclerosis. Negative visible bowel gas pattern. IMPRESSION: No acute fracture or dislocation identified about the left hip or pelvis. If occult hip fracture is suspected or if the patient is unable to weightbear, MRI is the preferred modality for further evaluation. Electronically Signed   By: Genevie Ann M.D.   On: 04/11/2018 21:12     Management plans discussed with the patient, family and they are in agreement.  CODE STATUS:     Code Status Orders  (From admission, onward)        Start     Ordered   04/12/18 0033  Full code  Continuous     04/12/18 0032    Code Status History    This patient has a current code status but no historical code status.    Advance Directive Documentation     Most Recent Value  Type of Advance Directive  Living will  Pre-existing out of facility DNR order (yellow form or pink MOST form)  -  "MOST" Form in Place?  -      TOTAL TIME TAKING CARE OF THIS PATIENT: *40* minutes.    Fritzi Mandes M.D on 04/13/2018 at 3:48 PM  Between 7am to 6pm - Pager - [phone] After 6pm go to [email or web address] - password [email or web address] [facility]  Office  [phone]  CC: Primary care physician; Valera Castle, MD

## 2018-04-13 NOTE — Clinical Social Work Note (Signed)
Clinical Social Work Assessment  Patient Details  Name: Cathy Barton MRN: [id number] Date of Birth: December 06, 1929  Date of referral:  04/13/18               Reason for consult:  Facility Placement                Permission sought to share information with:  Chartered certified accountant granted to share information::  Yes, Verbal Permission Granted  Name::      Cathy Barton::   Cathy Barton   Relationship::     Contact Information:     Housing/Transportation Living arrangements for the past 2 months:  [place] of Information:  Patient, Adult Children Patient Interpreter Needed:  None Criminal Activity/Legal Involvement Pertinent to Current Situation/Hospitalization:  No - Comment as needed Significant Relationships:  Adult Children Lives with:  Adult Children Do you feel safe going back to the place where you live?  Yes Need for family participation in patient care:  Yes (Comment)  Care giving concerns:  Patient lives in [place] with her son Cathy Barton.    Social Worker assessment / plan:  Holiday representative ([place]) received verbal consult from MD that patient is medically stable for D/C today and prefers [facility]. CSW met with patient and her son Cathy Barton was at bedside. Patient was alert and oriented X4 and was sitting up in the chair at bedside. CSW introduced self and explained role of CSW department. 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 FL2 complete and faxed out. Per Seth Bake admissions coordinator at [facility] she can accept patient today and has [facility] SNF authorization. Patient and her son Cathy Barton are aware of above and accepted bed offer from [facility].   Patient will D/C to [facility] today to room 329. RN will call report and arrange EMS for transport. CSW sent D/C orders to [facility] via [place]. Patient and her son Cathy Barton are aware of above.  Please reconsult if future social work needs arise. CSW signing off.    Employment status:  Retired Nurse, adult PT Recommendations:  [place] / Referral to community resources:  [place]  Patient/Family's Response to care:  Patient is agreeable to D/C to [facility].   Patient/Family's Understanding of and Emotional Response to Diagnosis, Current Treatment, and Prognosis:  Patient was very pleasant and thanked CSW for assistance.   Emotional Assessment Appearance:  Appears stated age Attitude/Demeanor/Rapport:    Affect (typically observed):  Accepting, Adaptable, Pleasant Orientation:  Oriented to Self, Oriented to Place, Oriented to  Time, Oriented to Situation Alcohol / Substance use:  Not Applicable Psych involvement (Current and /or in the community):  No (Comment)  Discharge Needs  Concerns to be addressed:  Discharge Planning Concerns Readmission within the last 30 days:  No Current discharge risk:  Dependent with Mobility Barriers to Discharge:  Continued Medical Work up   [employer], Veronia Beets, LCSW 04/13/2018, 5:59 PM

## 2018-04-14 DIAGNOSIS — K219 Gastro-esophageal reflux disease without esophagitis: Secondary | ICD-10-CM | POA: Diagnosis not present

## 2018-04-14 DIAGNOSIS — S329XXA Fracture of unspecified parts of lumbosacral spine and pelvis, initial encounter for closed fracture: Secondary | ICD-10-CM | POA: Diagnosis not present

## 2018-04-14 DIAGNOSIS — E119 Type 2 diabetes mellitus without complications: Secondary | ICD-10-CM | POA: Diagnosis not present

## 2018-04-14 DIAGNOSIS — I10 Essential (primary) hypertension: Secondary | ICD-10-CM | POA: Diagnosis not present

## 2018-04-14 DIAGNOSIS — G20 Parkinson's disease: Secondary | ICD-10-CM | POA: Diagnosis not present

## 2018-04-20 DIAGNOSIS — S30820A Blister (nonthermal) of lower back and pelvis, initial encounter: Secondary | ICD-10-CM | POA: Diagnosis not present

## 2018-11-05 ENCOUNTER — Ambulatory Visit: Payer: Medicare Other

## 2018-12-05 IMAGING — CR DG HIP (WITH OR WITHOUT PELVIS) 2-3V*L*
3 series · 3 of 3 positions shown · non-contrast
Comparison: None.

CLINICAL DATA: 87-year-old female status post fall at home on the
left side today. Subsequent painful weight-bearing.

EXAM:
DG HIP (WITH OR WITHOUT PELVIS) 2-3V LEFT

[pelvis ap]
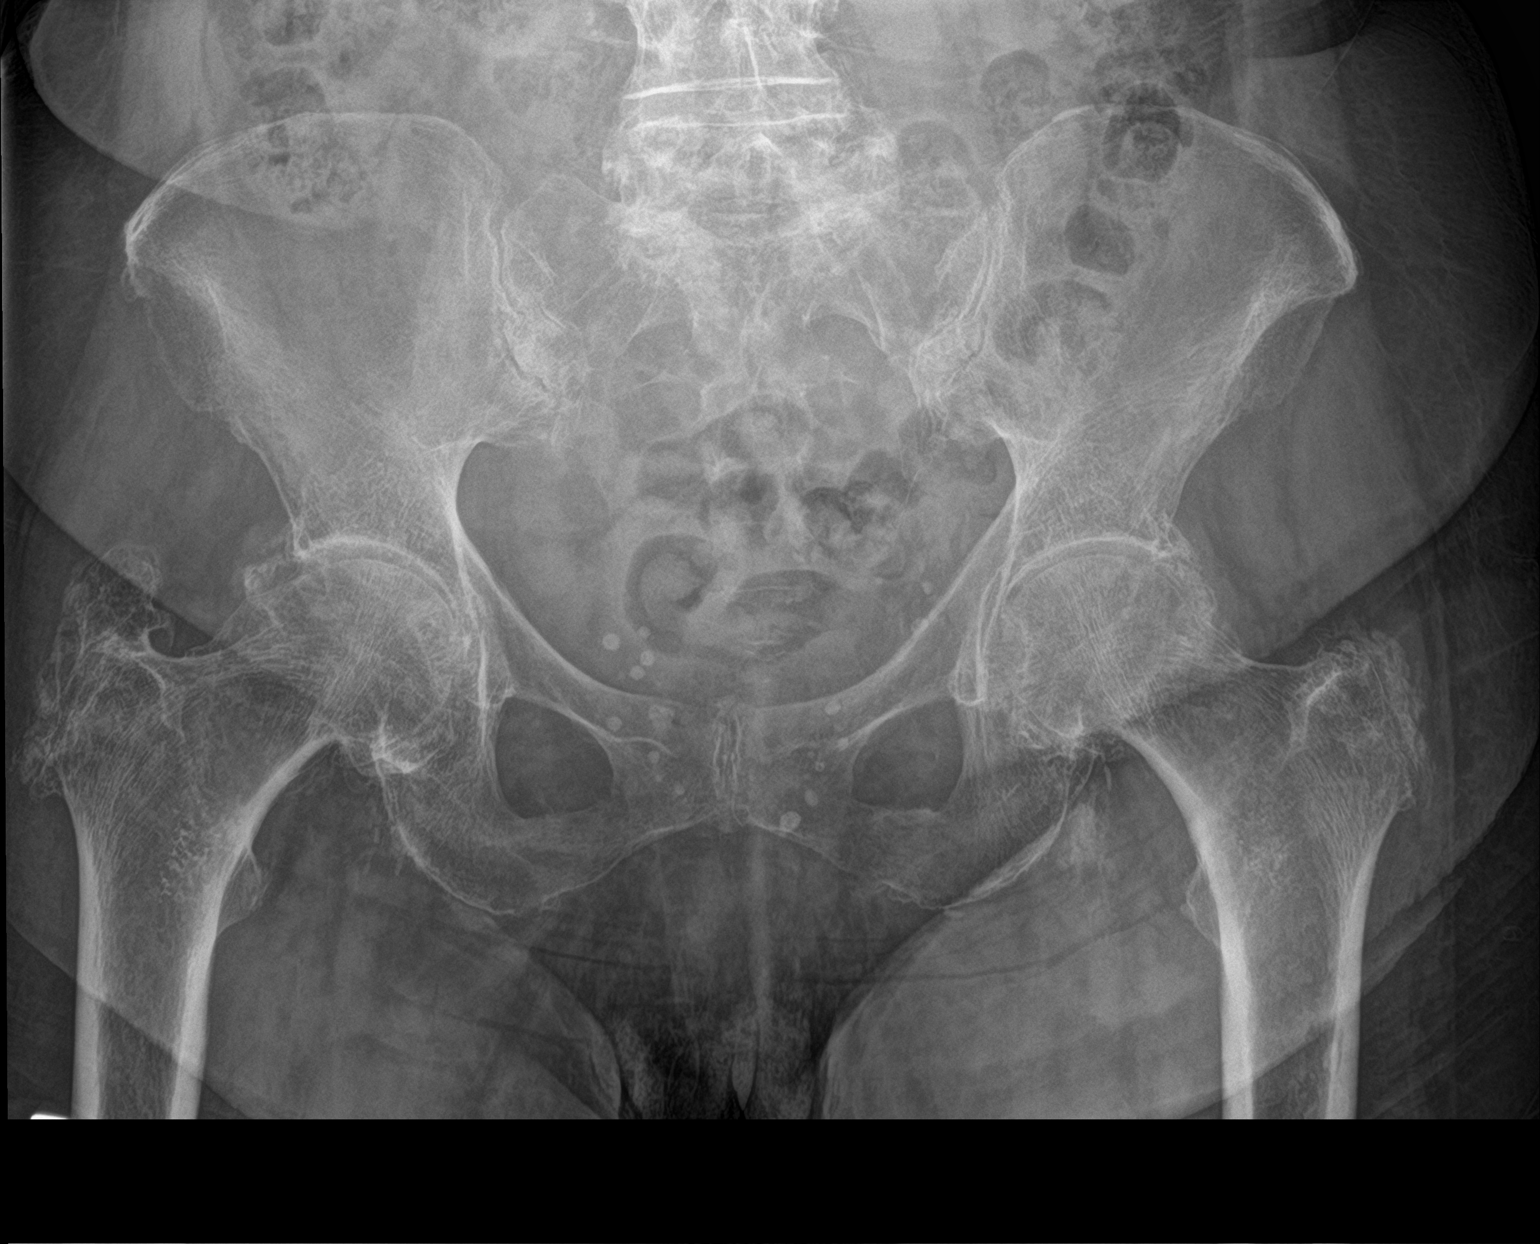

[hip ap]
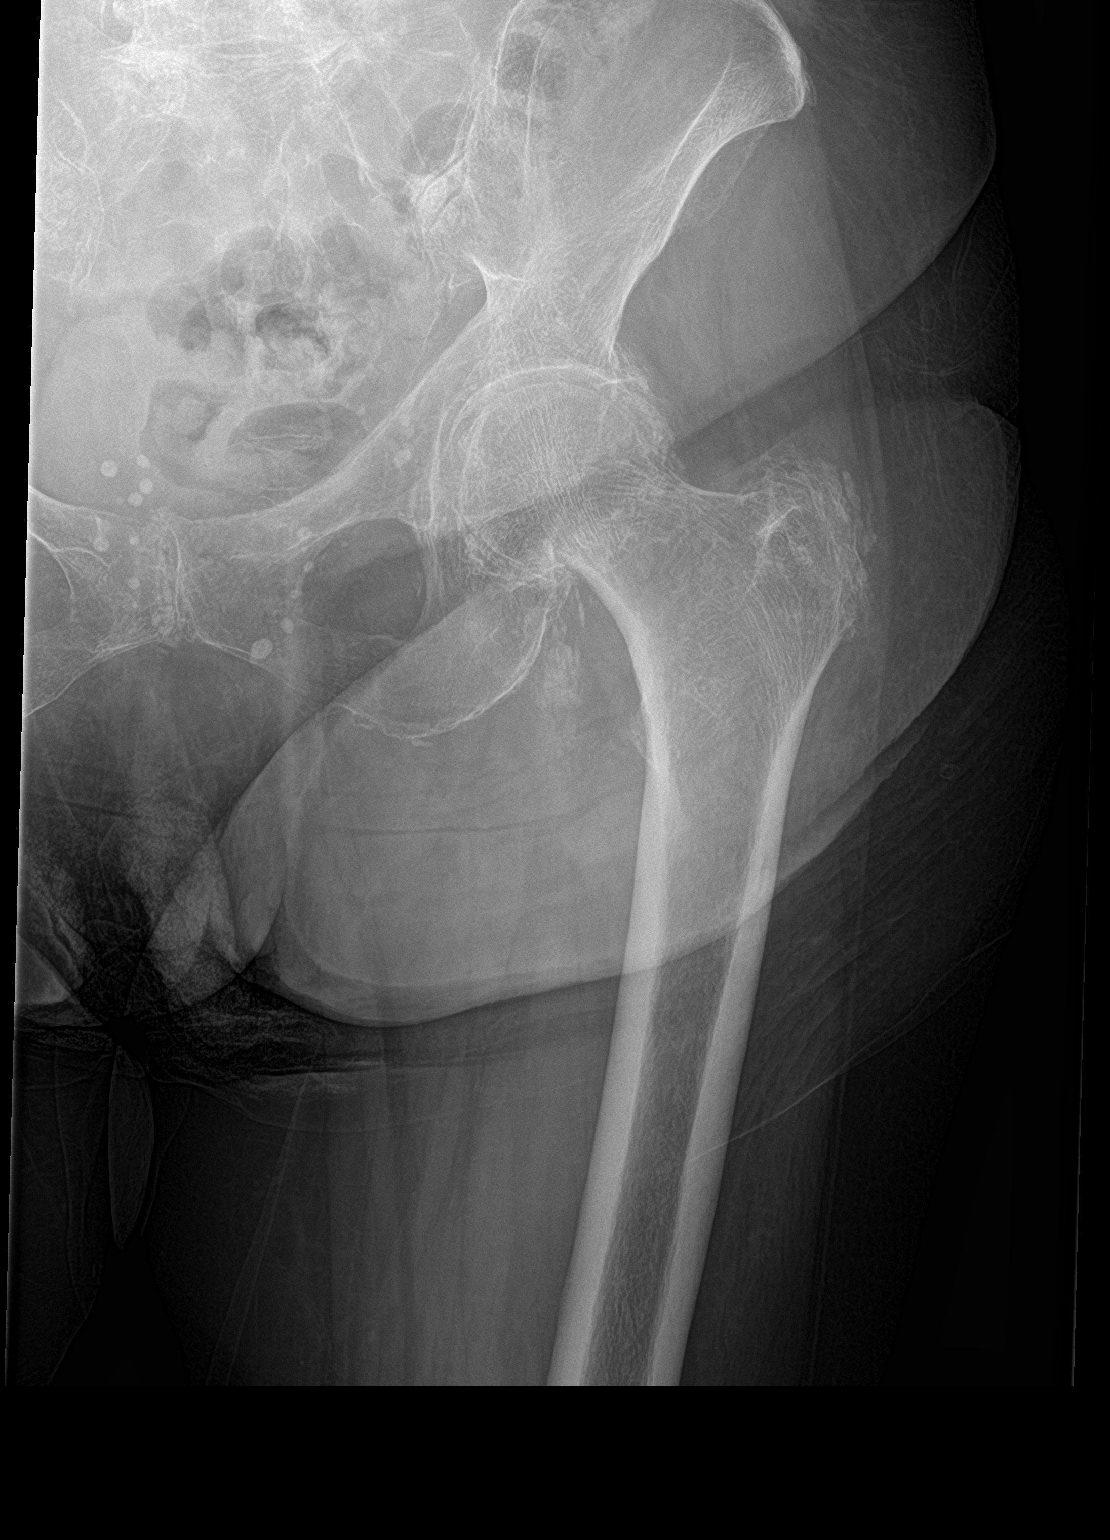

[hip lat]
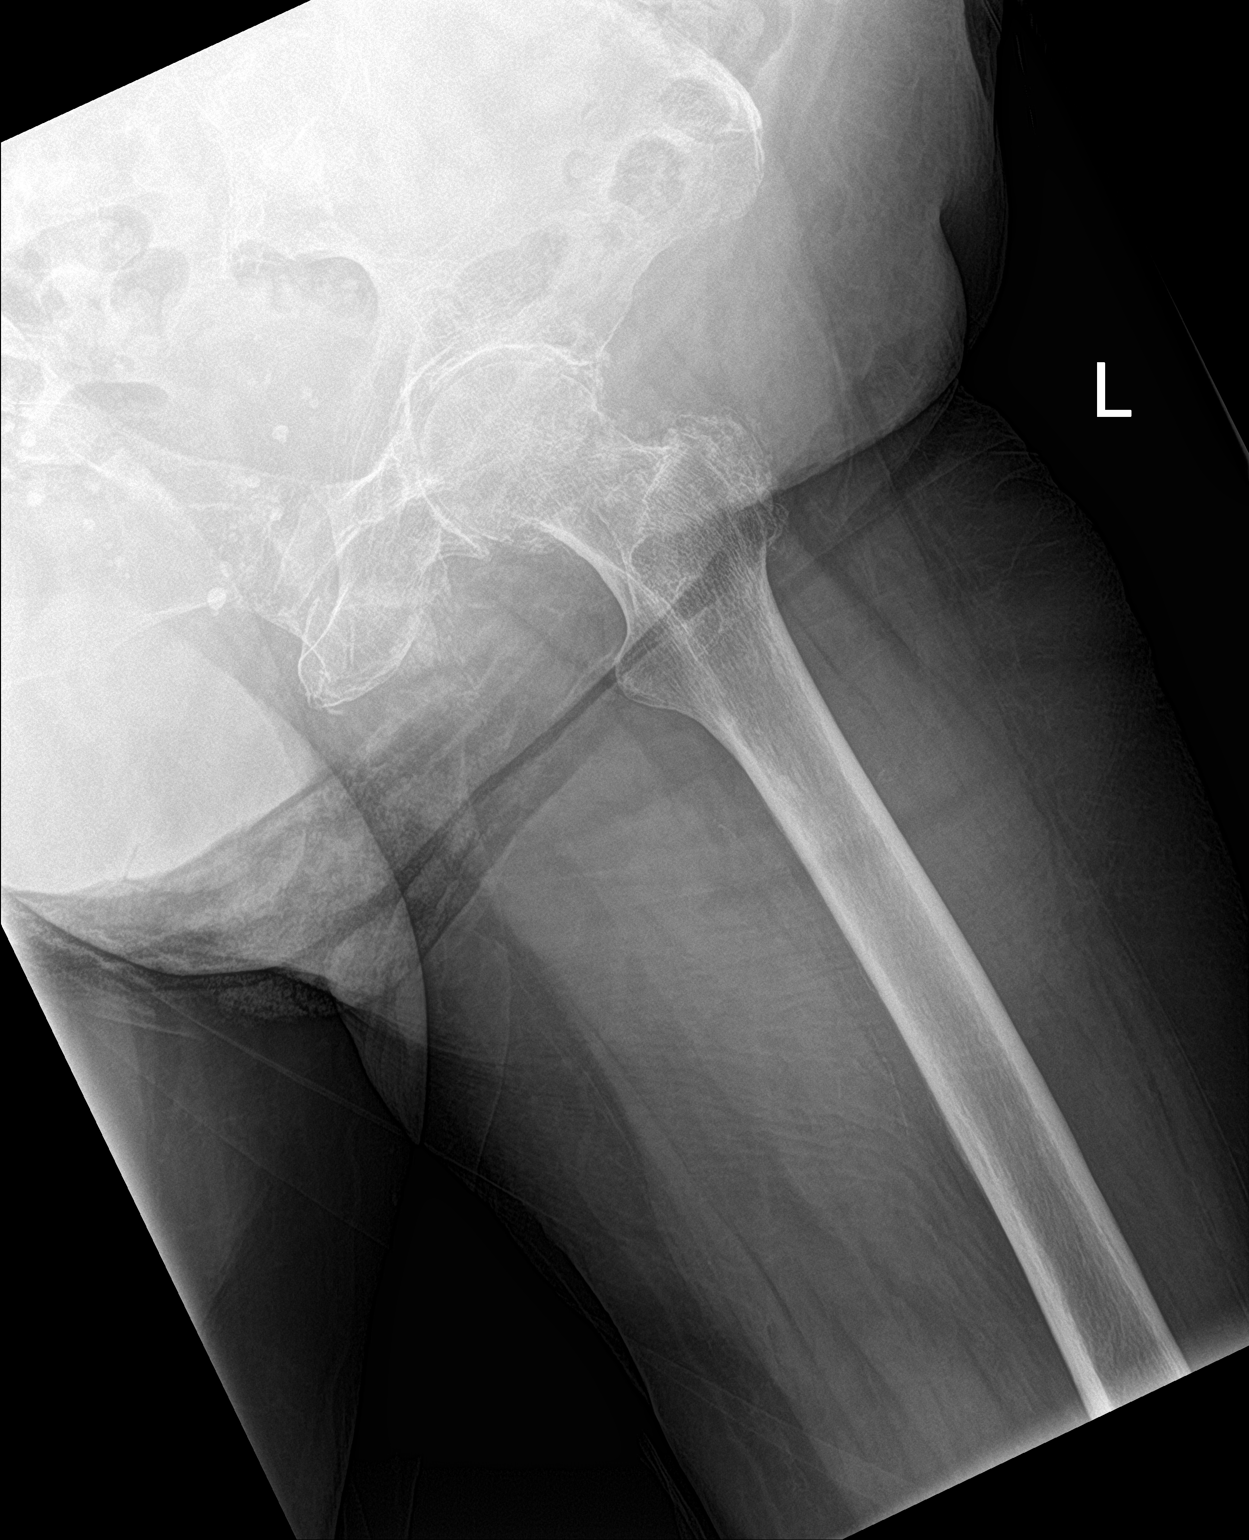

[3 of 3 positions shown; findings below may reference images not displayed]

FINDINGS: The femoral heads are normally located. The hip joint spaces appear
symmetric. No pelvis fracture identified. Heterotopic ossification
about both greater trochanters, more bulky on the right. Bilateral
femoral head and acetabular osteophytosis. Grossly intact proximal
right femur. No proximal left femur fracture identified.
Superimposed iliofemoral calcified atherosclerosis. Negative visible
bowel gas pattern.
IMPRESSION: No acute fracture or dislocation identified about the left hip or
pelvis.

If occult hip fracture is suspected or if the patient is unable to
weightbear, MRI is the preferred modality for further evaluation.

## 2018-12-05 IMAGING — CT CT HIP*L* W/O CM
2 of 3 series · 15 of 46 positions shown, 17 images · non-contrast
Comparison: 04/11/2018 radiographs of the left hip

CLINICAL DATA: Left hip pain after fall.

EXAM:
CT OF THE LEFT HIP WITHOUT CONTRAST
TECHNIQUE: Multidetector CT imaging of the left hip was performed according to
the standard protocol. Multiplanar CT image reconstructions were
also generated.

[Series 4: axial st · axial · 0.36mm/px · z∈[-172,-62]mm · 12 of 65 slices shown, 14 images]
[im 5/65  soft-tissue]
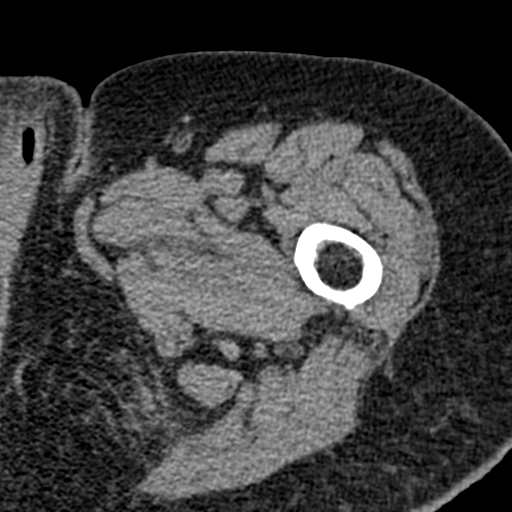
[im 5/65  bone]
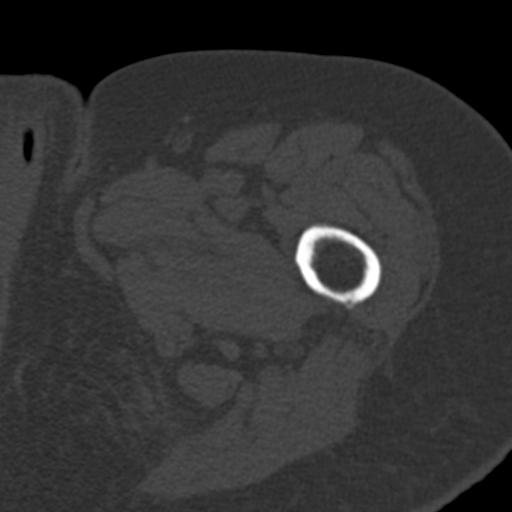
[im 9/65  soft-tissue]
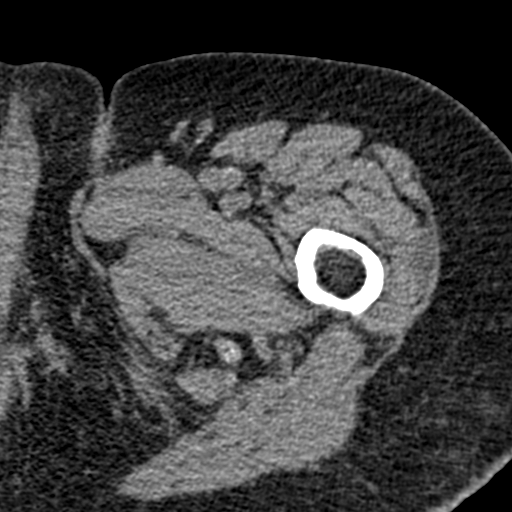
[im 15/65  soft-tissue]
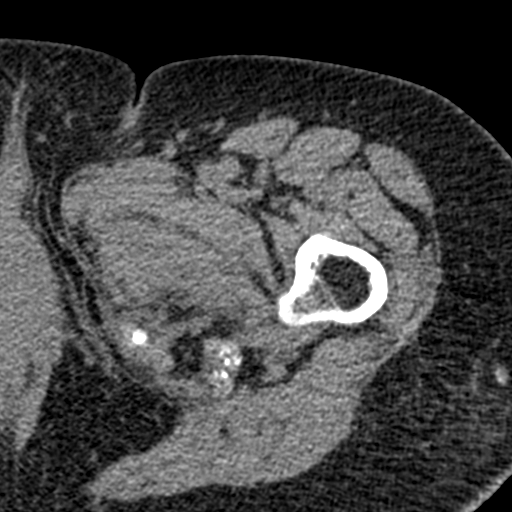
[im 19/65  soft-tissue]
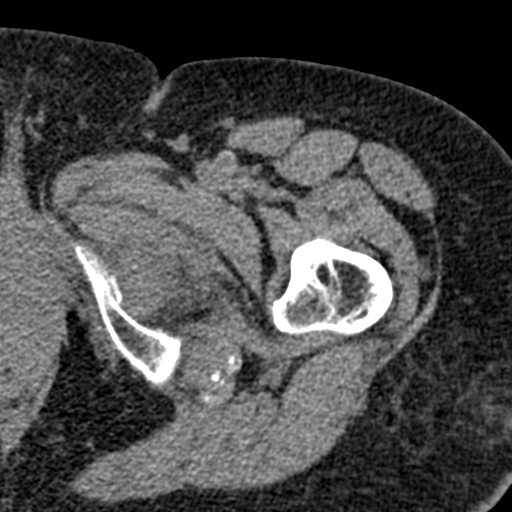
[im 25/65  soft-tissue]
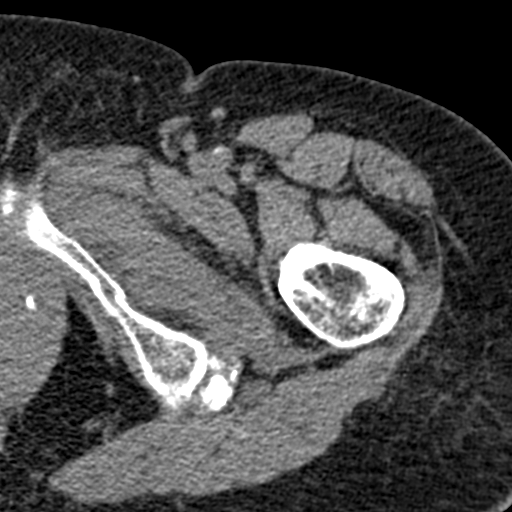
[im 29/65  soft-tissue]
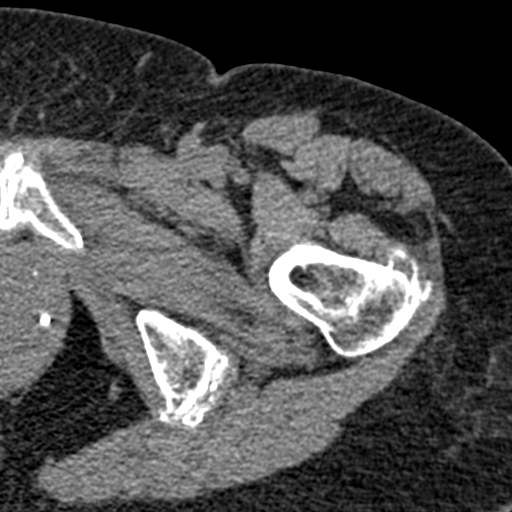
[im 36/65  soft-tissue]
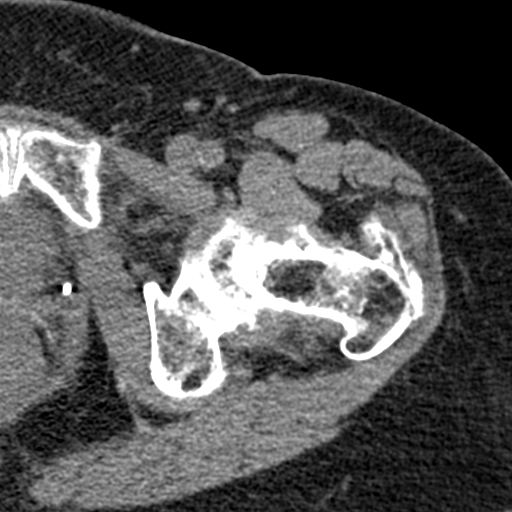
[im 40/65  soft-tissue]
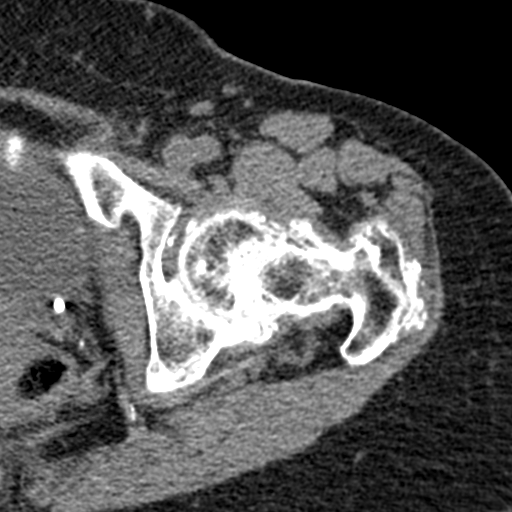
[im 46/65  soft-tissue]
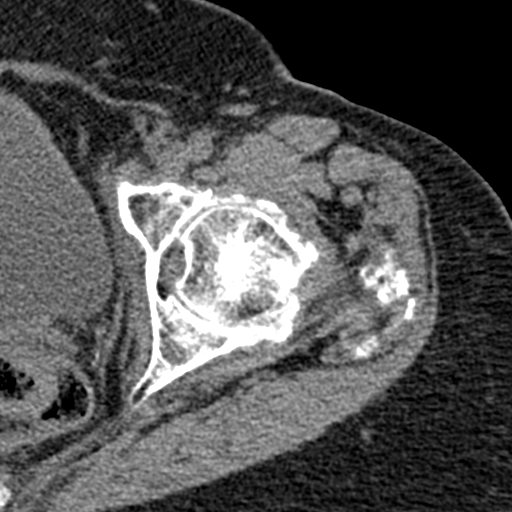
[im 46/65  bone]
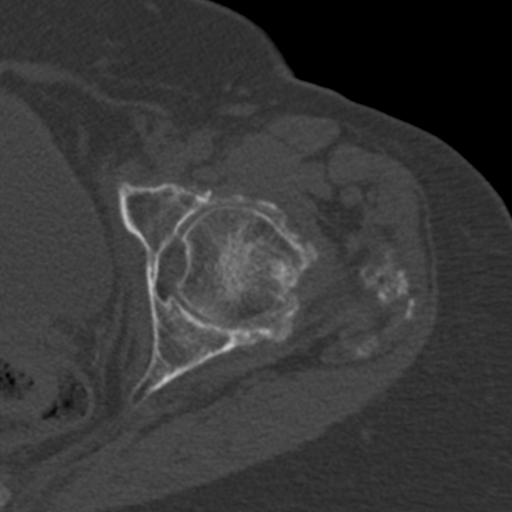
[im 50/65  soft-tissue]
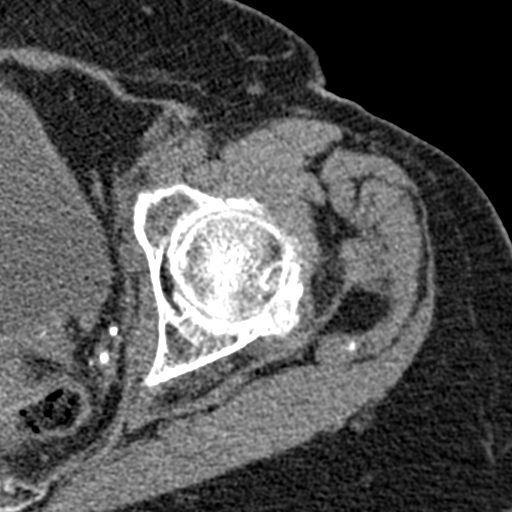
[im 56/65  soft-tissue]
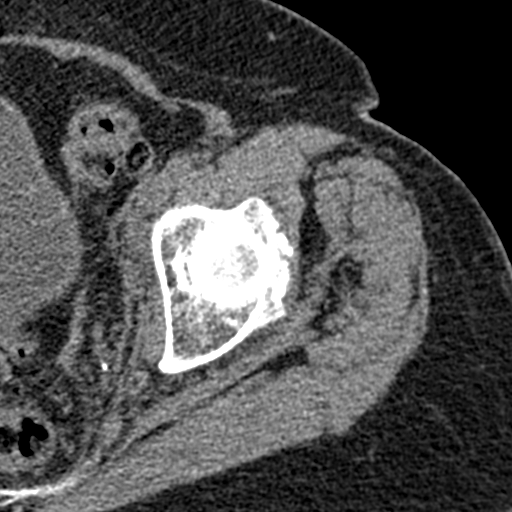
[im 60/65  soft-tissue]
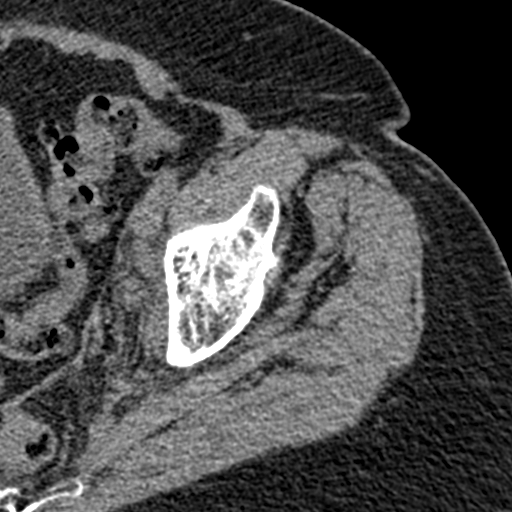

[Series 7: coronal st · coronal · 0.25mm/px · 3 of 93 slices shown]
[im 31/93  soft-tissue]
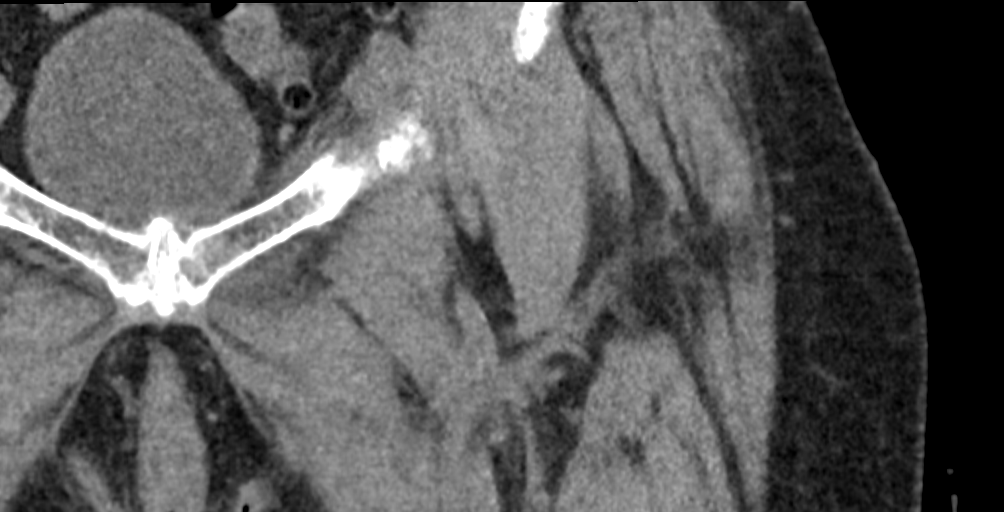
[im 41/93  soft-tissue]
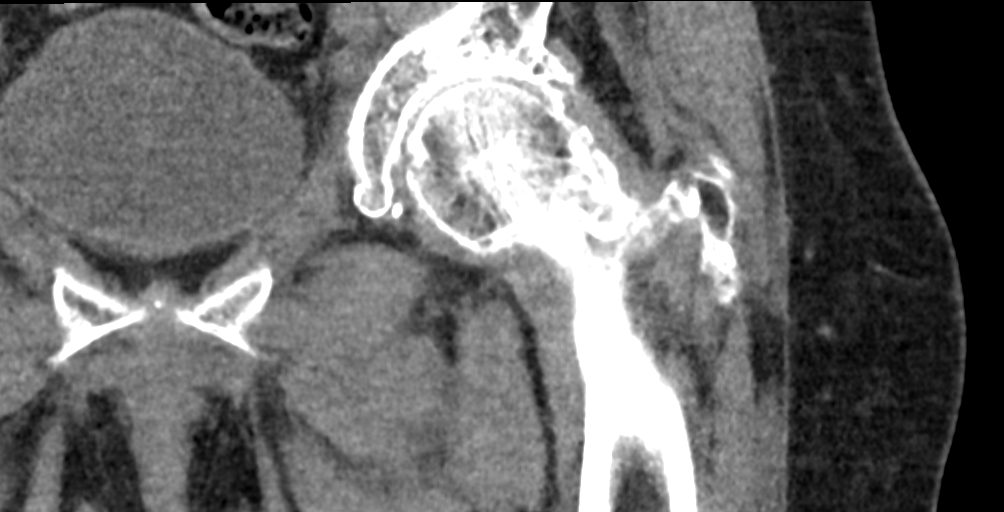
[im 52/93  soft-tissue]
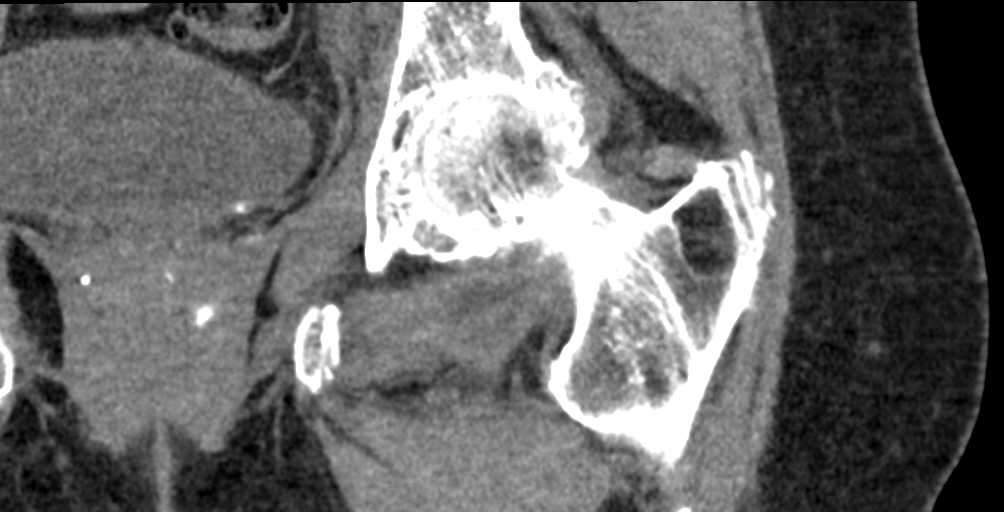

[15 of 46 positions shown; findings below may reference images not displayed]

FINDINGS: Bones/Joint/Cartilage

Acute appearing left anterior wall fracture of the acetabulum
without significant displacement, series [DATE]. Trace joint effusion.
Acute minimally displaced left inferior pubic ramus fracture with
fracture at the junction of the left superior pubic ramus and
acetabulum, series [DATE] and series [DATE]. No hip joint dislocation.
Enthesopathy of the gluteal muscle attachments of the greater
trochanter. Capsular calcifications about the left hip. No proximal
femoral fracture.

Ligaments

Suboptimally assessed on CT.

Muscles and Tendons

No muscle atrophy. No intramuscular hemorrhage.

Soft tissues

No subcutaneous hematoma.  No significant joint effusion.
IMPRESSION: 1. Acute nondisplaced appearing fractures of the left inferior and
superior pubic ramus, the superior pubic ramus fracture at the
junction of the acetabulum and pubic ramus.
2. Minimally displaced anterior wall fracture of the left
acetabulum.
3. No joint dislocation.  No proximal femoral fracture.

## 2019-05-10 ENCOUNTER — Other Ambulatory Visit: Payer: Self-pay | Admitting: Family Medicine

## 2019-05-10 DIAGNOSIS — Z1231 Encounter for screening mammogram for malignant neoplasm of breast: Secondary | ICD-10-CM

## 2019-09-08 ENCOUNTER — Other Ambulatory Visit: Payer: Self-pay | Admitting: Family Medicine

## 2019-09-08 DIAGNOSIS — Z20822 Contact with and (suspected) exposure to covid-19: Secondary | ICD-10-CM

## 2019-09-09 LAB — NOVEL CORONAVIRUS, NAA: SARS-CoV-2, NAA: NOT DETECTED

## 2019-09-14 ENCOUNTER — Other Ambulatory Visit: Payer: Self-pay | Admitting: Family Medicine

## 2019-09-14 DIAGNOSIS — N644 Mastodynia: Secondary | ICD-10-CM

## 2020-04-05 ENCOUNTER — Other Ambulatory Visit: Payer: Self-pay | Admitting: Family Medicine

## 2020-04-05 DIAGNOSIS — Z1231 Encounter for screening mammogram for malignant neoplasm of breast: Secondary | ICD-10-CM

## 2020-12-26 ENCOUNTER — Other Ambulatory Visit: Payer: Self-pay | Admitting: Family Medicine

## 2020-12-26 DIAGNOSIS — Z1231 Encounter for screening mammogram for malignant neoplasm of breast: Secondary | ICD-10-CM

## 2022-01-11 ENCOUNTER — Other Ambulatory Visit: Payer: Self-pay | Admitting: Family Medicine

## 2022-01-11 DIAGNOSIS — N644 Mastodynia: Secondary | ICD-10-CM

## 2022-12-20 ENCOUNTER — Emergency Department: Payer: Medicare PPO

## 2022-12-20 ENCOUNTER — Encounter: Payer: Self-pay | Admitting: Emergency Medicine

## 2022-12-20 ENCOUNTER — Other Ambulatory Visit: Payer: Self-pay

## 2022-12-20 ENCOUNTER — Other Ambulatory Visit: Payer: Medicare PPO

## 2022-12-20 ENCOUNTER — Emergency Department
Admission: EM | Admit: 2022-12-20 | Discharge: 2022-12-20 | Disposition: A | Discharge status: Home / Self Care | Payer: Medicare PPO | Attending: Student in an Organized Health Care Education/Training Program | Admitting: Student in an Organized Health Care Education/Training Program

## 2022-12-20 DIAGNOSIS — R531 Weakness: Secondary | ICD-10-CM | POA: Insufficient documentation

## 2022-12-20 DIAGNOSIS — R609 Edema, unspecified: Secondary | ICD-10-CM

## 2022-12-20 DIAGNOSIS — R600 Localized edema: Secondary | ICD-10-CM | POA: Insufficient documentation

## 2022-12-20 DIAGNOSIS — M7989 Other specified soft tissue disorders: Secondary | ICD-10-CM | POA: Diagnosis present

## 2022-12-20 DIAGNOSIS — G20C Parkinsonism, unspecified: Secondary | ICD-10-CM | POA: Insufficient documentation

## 2022-12-20 LAB — BASIC METABOLIC PANEL
Anion gap: 4 — ABNORMAL LOW (ref 5–15)
BUN: 42 mg/dL — ABNORMAL HIGH (ref 8–23)
CO2: 23 mmol/L (ref 22–32)
Calcium: 9 mg/dL (ref 8.9–10.3)
Chloride: 107 mmol/L (ref 98–111)
Creatinine, Ser: 1.38 mg/dL — ABNORMAL HIGH (ref 0.44–1.00)
GFR, Estimated: 36 mL/min — ABNORMAL LOW (ref >60)
Glucose, Bld: 95 mg/dL (ref 70–99)
Potassium: 3.5 mmol/L (ref 3.5–5.1)
Sodium: 134 mmol/L — ABNORMAL LOW (ref 135–145)

## 2022-12-20 LAB — CBC
HCT: 24.8 % — ABNORMAL LOW (ref 36.0–46.0)
Hemoglobin: 8.4 g/dL — ABNORMAL LOW (ref 12.0–15.0)
MCH: 28.4 pg (ref 26.0–34.0)
MCHC: 33.9 g/dL (ref 30.0–36.0)
MCV: 83.8 fL (ref 80.0–100.0)
Platelets: 280 10*3/uL (ref 150–400)
RBC: 2.96 MIL/uL — ABNORMAL LOW (ref 3.87–5.11)
RDW: 14.3 % (ref 11.5–15.5)
WBC: 9.9 10*3/uL (ref 4.0–10.5)
nRBC: 0 % (ref 0.0–0.2)

## 2022-12-20 LAB — URINALYSIS, ROUTINE W REFLEX MICROSCOPIC
Bilirubin Urine: NEGATIVE
Glucose, UA: NEGATIVE mg/dL
Hgb urine dipstick: NEGATIVE
Ketones, ur: NEGATIVE mg/dL
Nitrite: NEGATIVE
Protein, ur: NEGATIVE mg/dL
Specific Gravity, Urine: 1.01 (ref 1.005–1.030)
pH: 5 (ref 5.0–8.0)

## 2022-12-20 LAB — TROPONIN I (HIGH SENSITIVITY)
Troponin I (High Sensitivity): 6 ng/L (ref <18)
Troponin I (High Sensitivity): 8 ng/L (ref <18)

## 2022-12-20 LAB — HEPATIC FUNCTION PANEL
ALT: <5 U/L (ref 0–44)
AST: 12 U/L — ABNORMAL LOW (ref 15–41)
Albumin: 3.3 g/dL — ABNORMAL LOW (ref 3.5–5.0)
Alkaline Phosphatase: 77 U/L (ref 38–126)
Bilirubin, Direct: <0.1 mg/dL (ref 0.0–0.2)
Total Bilirubin: 0.6 mg/dL (ref 0.3–1.2)
Total Protein: 7.4 g/dL (ref 6.5–8.1)

## 2022-12-20 LAB — BRAIN NATRIURETIC PEPTIDE: B Natriuretic Peptide: 69 pg/mL (ref 0.0–100.0)

## 2022-12-20 NOTE — ED Notes (Signed)
Called [facility] for transport back to pt home

## 2022-12-20 NOTE — ED Triage Notes (Signed)
Patient to ED via [facility] from home for generalized weakness. Patient had a fall today but not c/o any issues. Been feeling weak for the past 2-3 weeks. Hx of Parkinson's and dementia. AOx4

## 2022-12-20 NOTE — Discharge Instructions (Signed)
Your lab tests and leg ultrasounds were all okay today.  We don't see any blood clots in the legs.  Continue taking your medications and follow up with your doctor this week.

## 2022-12-20 NOTE — ED Provider Notes (Signed)
Procedures  Clinical Course as of 12/20/22 1921  Ludwig Clarks Dec 20, 2022  1431 Chest x-ray my review and interpretation without evidence of consolidation or effusion. [PR]  1508 BNP is normal.  LFTs normal borderline low albumin.  Rectal exam with soft formed light brown stool guaiac negative.  Suspect chronic anemia.  Further question patient did not have syncopal episode no fall or head injury.  Noted numbness or tingling.  Did report some chest pain earlier now will add on serial enzymes if enzymes are negative do think patient may be appropriate for outpatient follow-up.  Patient will be signed out to oncoming physician pending follow-up troponin. [PR]    Clinical Course User Index [PR] Merlyn Lot, MD    ----------------------------------------- 7:21 PM on 12/20/2022 -----------------------------------------  Ultrasound bilateral lower extremity interpreted by me, negative for DVT.  Radiology report reviewed.  Labs including serial troponins and urinalysis unremarkable.  Stable for discharge home.    Carrie Mew, MD 12/20/22 [place]

## 2022-12-20 NOTE — ED Notes (Signed)
Pt came to hospital via EMS. Pt here with her son states she was having difficulty standing up or moving around with her walker for approximately the last week and a half. Both the patients right and left leg from the knee caps down are extremely swollen along with her ankles. Pt has no hx of CHF, blood clots or kidney issues. Pt denies any dark or bloody stools. Pt does have parkinsons disease.

## 2022-12-20 NOTE — ED Provider Notes (Signed)
[facility] Provider Note    Event Date/Time   First MD Initiated Contact with Patient 12/20/22 1331     (approximate)   History   Weakness  HPI  Cathy Barton is a 89 y.o. female history of Parkinson's disorder presents to the ER for evaluation of lower extremity swelling and weakness with standing after getting out of the couch earlier today.  She denies any numbness or tingling.  Did not fall.  No head head injury.  No headaches.  Fully did have episode of chest pain earlier today.  Denies any chest pain right now.     Physical Exam   Triage Vital Signs: ED Triage Vitals  Enc Vitals Group     BP 12/20/22 1055 119/63     Pulse Rate 12/20/22 1055 82     Resp 12/20/22 1055 18     Temp 12/20/22 1055 97.6 F (36.4 C)     Temp Source 12/20/22 1055 Oral     SpO2 12/20/22 1054 97 %     Weight --      Height --      Head Circumference --      Peak Flow --      Pain Score 12/20/22 1055 0     Pain Loc --      Pain Edu? --      Excl. in [place]? --     Most recent vital signs: Vitals:   12/20/22 1055 12/20/22 1403  BP: 119/63 132/71  Pulse: 82 89  Resp: 18 (!) 22  Temp: 97.6 F (36.4 C) 97.6 F (36.4 C)  SpO2: 99% 99%     Constitutional: Alert  Eyes: Conjunctivae are normal.  Head: Atraumatic. Nose: No congestion/rhinnorhea. Mouth/Throat: Mucous membranes are moist.   Neck: Painless ROM.  Cardiovascular:   Good peripheral circulation. Respiratory: Normal respiratory effort.  No retractions.  Gastrointestinal: Soft and nontender.  Musculoskeletal:  no deformity.  BLE edema.  N/v intact Neurologic:  MAE spontaneously. No gross focal neurologic deficits are appreciated.  Skin:  Skin is warm, dry and intact. No rash noted. Psychiatric: Mood and affect are normal. Speech and behavior are normal.    ED Results / Procedures / Treatments   Labs (all labs ordered are listed, but only abnormal results are displayed) Labs Reviewed  BASIC  METABOLIC PANEL - Abnormal; Notable for the following components:      Result Value   Sodium 134 (*)    BUN 42 (*)    Creatinine, Ser 1.38 (*)    GFR, Estimated 36 (*)    Anion gap 4 (*)    All other components within normal limits  CBC - Abnormal; Notable for the following components:   RBC 2.96 (*)    Hemoglobin 8.4 (*)    HCT 24.8 (*)    All other components within normal limits  HEPATIC FUNCTION PANEL - Abnormal; Notable for the following components:   Albumin 3.3 (*)    AST 12 (*)    All other components within normal limits  BRAIN NATRIURETIC PEPTIDE  URINALYSIS, ROUTINE W REFLEX MICROSCOPIC  CBG MONITORING, ED  TROPONIN I (HIGH SENSITIVITY)     EKG  ED ECG REPORT I, Merlyn Lot, the attending physician, personally viewed and interpreted this ECG.   Date: 12/20/2022  EKG Time: 10:56  Rate: 75  Rhythm: sinus  Axis: normal  Intervals: normal  ST&T Change: normal    RADIOLOGY Please see ED Course for my review  and interpretation.  I personally reviewed all radiographic images ordered to evaluate for the above acute complaints and reviewed radiology reports and findings.  These findings were personally discussed with the patient.  Please see medical record for radiology report.    PROCEDURES:  Critical Care performed:   Procedures   MEDICATIONS ORDERED IN ED: Medications - No data to display   IMPRESSION / MDM / [place] / ED COURSE  I reviewed the triage vital signs and the nursing notes.                              Differential diagnosis includes, but is not limited to, DVT, CHF, lymphedema, fracture, anemia, nephrotic syndrome, AKI, ACS, medication effect, UTI  Patient presenting to the ER for evaluation of symptoms as described above.  Based on symptoms, risk factors and considered above differential, this presenting complaint could reflect a potentially life-threatening illness therefore the patient will be placed on continuous  pulse oximetry and telemetry for monitoring.  Laboratory evaluation will be sent to evaluate for the above complaints.      Clinical Course as of 12/20/22 1521  Fri Dec 20, 2022  1431 Chest x-ray my review and interpretation without evidence of consolidation or effusion. [PR]  1508 BNP is normal.  LFTs normal borderline low albumin.  Rectal exam with soft formed light brown stool guaiac negative.  Suspect chronic anemia.  Further question patient did not have syncopal episode no fall or head injury.  Noted numbness or tingling.  Did report some chest pain earlier now will add on serial enzymes if enzymes are negative do think patient may be appropriate for outpatient follow-up.  Patient will be signed out to oncoming physician pending follow-up troponin. [PR]    Clinical Course User Index [PR] Merlyn Lot, MD     FINAL CLINICAL IMPRESSION(S) / ED DIAGNOSES   Final diagnoses:  Peripheral edema     Rx / DC Orders   ED Discharge Orders     None        Note:  This document was prepared using Dragon voice recognition software and may include unintentional dictation errors.    Merlyn Lot, MD 12/20/22 [phone]

## 2022-12-20 NOTE — ED Notes (Signed)
This patient is here along with her son at bedside. They brought to my attention she is feeling unsafe in her own home. She is afraid of being sent home to the same environment. Her daughter currently lives with her apparently she has a mental illness (it is the patients home and the patient owns) they want to report to adult protective services apparently they have filed in the past. Social work was notified.

## 2023-03-24 ENCOUNTER — Other Ambulatory Visit: Payer: Self-pay

## 2023-03-24 ENCOUNTER — Emergency Department

## 2023-03-24 ENCOUNTER — Encounter: Payer: Self-pay | Admitting: Intensive Care

## 2023-03-24 ENCOUNTER — Emergency Department
Admission: EM | Admit: 2023-03-24 | Discharge: 2023-03-24 | Disposition: A | Discharge status: Hospice | Attending: Emergency Medicine | Admitting: Emergency Medicine

## 2023-03-24 DIAGNOSIS — F0390 Unspecified dementia without behavioral disturbance: Secondary | ICD-10-CM | POA: Insufficient documentation

## 2023-03-24 DIAGNOSIS — R0789 Other chest pain: Secondary | ICD-10-CM | POA: Diagnosis not present

## 2023-03-24 DIAGNOSIS — R109 Unspecified abdominal pain: Secondary | ICD-10-CM | POA: Insufficient documentation

## 2023-03-24 DIAGNOSIS — G20A1 Parkinson's disease without dyskinesia, without mention of fluctuations: Secondary | ICD-10-CM | POA: Insufficient documentation

## 2023-03-24 DIAGNOSIS — Y92009 Unspecified place in unspecified non-institutional (private) residence as the place of occurrence of the external cause: Secondary | ICD-10-CM | POA: Insufficient documentation

## 2023-03-24 DIAGNOSIS — W010XXA Fall on same level from slipping, tripping and stumbling without subsequent striking against object, initial encounter: Secondary | ICD-10-CM | POA: Insufficient documentation

## 2023-03-24 DIAGNOSIS — M25572 Pain in left ankle and joints of left foot: Secondary | ICD-10-CM | POA: Diagnosis not present

## 2023-03-24 DIAGNOSIS — W19XXXA Unspecified fall, initial encounter: Secondary | ICD-10-CM

## 2023-03-24 DIAGNOSIS — N390 Urinary tract infection, site not specified: Secondary | ICD-10-CM | POA: Diagnosis not present

## 2023-03-24 DIAGNOSIS — M25571 Pain in right ankle and joints of right foot: Secondary | ICD-10-CM | POA: Diagnosis not present

## 2023-03-24 LAB — URINALYSIS, ROUTINE W REFLEX MICROSCOPIC
Bilirubin Urine: NEGATIVE
Glucose, UA: NEGATIVE mg/dL
Hgb urine dipstick: NEGATIVE
Ketones, ur: NEGATIVE mg/dL
Nitrite: POSITIVE — AB
Protein, ur: NEGATIVE mg/dL
Specific Gravity, Urine: 1.017 (ref 1.005–1.030)
pH: 7 (ref 5.0–8.0)

## 2023-03-24 LAB — CBC WITH DIFFERENTIAL/PLATELET
Abs Immature Granulocytes: 0.03 10*3/uL (ref 0.00–0.07)
Basophils Absolute: 0.1 10*3/uL (ref 0.0–0.1)
Basophils Relative: 1 %
Eosinophils Absolute: 0 10*3/uL (ref 0.0–0.5)
Eosinophils Relative: 0 %
HCT: 29.4 % — ABNORMAL LOW (ref 36.0–46.0)
Hemoglobin: 9.7 g/dL — ABNORMAL LOW (ref 12.0–15.0)
Immature Granulocytes: 0 %
Lymphocytes Relative: 17 %
Lymphs Abs: 1.8 10*3/uL (ref 0.7–4.0)
MCH: 28.2 pg (ref 26.0–34.0)
MCHC: 33 g/dL (ref 30.0–36.0)
MCV: 85.5 fL (ref 80.0–100.0)
Monocytes Absolute: 0.8 10*3/uL (ref 0.1–1.0)
Monocytes Relative: 8 %
Neutro Abs: 7.6 10*3/uL (ref 1.7–7.7)
Neutrophils Relative %: 74 %
Platelets: 268 10*3/uL (ref 150–400)
RBC: 3.44 MIL/uL — ABNORMAL LOW (ref 3.87–5.11)
RDW: 14.8 % (ref 11.5–15.5)
WBC: 10.4 10*3/uL (ref 4.0–10.5)
nRBC: 0 % (ref 0.0–0.2)

## 2023-03-24 LAB — COMPREHENSIVE METABOLIC PANEL
ALT: 7 U/L (ref 0–44)
AST: 18 U/L (ref 15–41)
Albumin: 3.5 g/dL (ref 3.5–5.0)
Alkaline Phosphatase: 70 U/L (ref 38–126)
Anion gap: 8 (ref 5–15)
BUN: 25 mg/dL — ABNORMAL HIGH (ref 8–23)
CO2: 24 mmol/L (ref 22–32)
Calcium: 9.3 mg/dL (ref 8.9–10.3)
Chloride: 106 mmol/L (ref 98–111)
Creatinine, Ser: 1.24 mg/dL — ABNORMAL HIGH (ref 0.44–1.00)
GFR, Estimated: 41 mL/min — ABNORMAL LOW (ref >60)
Glucose, Bld: 109 mg/dL — ABNORMAL HIGH (ref 70–99)
Potassium: 4.5 mmol/L (ref 3.5–5.1)
Sodium: 138 mmol/L (ref 135–145)
Total Bilirubin: 0.6 mg/dL (ref 0.3–1.2)
Total Protein: 7.6 g/dL (ref 6.5–8.1)

## 2023-03-24 LAB — TROPONIN I (HIGH SENSITIVITY): Troponin I (High Sensitivity): 14 ng/L (ref <18)

## 2023-03-24 LAB — CK: Total CK: 70 U/L (ref 38–234)

## 2023-03-24 MED ORDER — CEFDINIR 300 MG PO CAPS: 300.0000 mg | ORAL_CAPSULE | Freq: Two times a day (BID) | ORAL | 0 refills | Status: AC

## 2023-03-24 MED ORDER — IOHEXOL 300 MG/ML  SOLN
80.0000 mL | Freq: Once | INTRAMUSCULAR | Status: AC | PRN
  Administered 2023-03-24: 80 mL via INTRAVENOUS

## 2023-03-24 NOTE — ED Provider Notes (Signed)
[facility] Provider Note    Event Date/Time   First MD Initiated Contact with Patient 03/24/23 0920     (approximate)   History   Fall   HPI  Cathy Barton is a 89 y.o. female with Parkinson's and dementia who comes in with concerns for a fall.  According to patient she states that she had a mechanical fall when she slipped and landed on her butt.  She reportedly was on the ground a prolonged period of time.  She states that her daughter called EMS this morning given she was complaining of some pain.  She told the nurse that she was having leg pain but to me she reported hitting her head and abdominal pain.  She states that she does not want want to go to the hospice home.  She reports that daughter has been doing well with trying to help feed her.  However she told me that the fall happened last night after dinnertime and that she had told her daughter to call EMS but the daughter did not want to call EMS and told her that she just needed to stand up.  Therefore the patient laid down on the ground all night long until this morning when she called EMS.    [facility]- She started on hospice in 01/29/23. Tomorrow she is going to hospice home for 5 days for respite stay. They are aware that patient has not really wanted to go to hospice home but the daughter is overwhelmed with taking care of her.  Daughter-[name] [phone]. With him to hospice the fall was at 724 this morning when they talk to [facility]     Physical Exam   Triage Vital Signs: ED Triage Vitals  Enc Vitals Group     BP 03/24/23 0850 (!) 159/87     Pulse Rate 03/24/23 0850 93     Resp 03/24/23 0850 18     Temp 03/24/23 0850 98.4 F (36.9 C)     Temp Source 03/24/23 0850 Oral     SpO2 03/24/23 0850 96 %     Weight 03/24/23 0843 127 lb 13.9 oz (58 kg)     Height 03/24/23 0843  (1.575 m)     Head Circumference --      Peak Flow --      Pain Score 03/24/23 0843 7     Pain Loc  --      Pain Edu? --      Excl. in GC? --     Most recent vital signs: Vitals:   03/24/23 0850  BP: (!) 159/87  Pulse: 93  Resp: 18  Temp: 98.4 F (36.9 C)  SpO2: 96%     General: Awake, no distress.  AO x3 CV:  Good peripheral perfusion.  Resp:  Normal effort.  Abd:  No distention.  Other:  Patient able to lift both legs up off the bed.  She is got good distal pulses.  She reports some tenderness in her ankles and some mild chest wall tenderness with palpation.  ED Results / Procedures / Treatments   Labs (all labs ordered are listed, but only abnormal results are displayed) Labs Reviewed  CBC WITH DIFFERENTIAL/PLATELET - Abnormal; Notable for the following components:      Result Value   RBC 3.44 (*)    Hemoglobin 9.7 (*)    HCT 29.4 (*)    All other components within normal limits  COMPREHENSIVE METABOLIC PANEL - Abnormal;  Notable for the following components:   Glucose, Bld 109 (*)    BUN 25 (*)    Creatinine, Ser 1.24 (*)    GFR, Estimated 41 (*)    All other components within normal limits  URINALYSIS, ROUTINE W REFLEX MICROSCOPIC - Abnormal; Notable for the following components:   Color, Urine YELLOW (*)    APPearance HAZY (*)    Nitrite POSITIVE (*)    Leukocytes,Ua LARGE (*)    Bacteria, UA RARE (*)    All other components within normal limits  URINE CULTURE  CK  TROPONIN I (HIGH SENSITIVITY)     RADIOLOGY I have reviewed the xray personally and interpreted and no evidence of any fracture.  Reviewed the chest x-ray does have an old left humeral fracture.   PROCEDURES:  Critical Care performed: No  .1-3 Lead EKG Interpretation  Performed by: Concha Se, MD Authorized by: Concha Se, MD     Interpretation: normal     ECG rate:  80   ECG rate assessment: normal     Rhythm: sinus rhythm     Ectopy: none     Conduction: normal      MEDICATIONS ORDERED IN ED: Medications  iohexol (OMNIPAQUE) 300 MG/ML solution 80 mL (80 mLs  Intravenous Contrast Given 03/24/23 1216)     IMPRESSION / MDM / ASSESSMENT AND PLAN / ED COURSE  I reviewed the triage vital signs and the nursing notes.   Patient's presentation is most consistent with acute presentation with potential threat to life or bodily function.   Patient comes in with what sounds like a mechanical fall. Will get some basic labs given concern for not exactly sure how long patient was on the ground for.  Will discuss with hospice as well -discussed CT imaging and patient's goals of care and she would like to proceed with CTs and labs she is not really interested in hospice at this time.  I am concerned about this potential that patient was on the ground all night long and the daughter may be did not call but I am not clear if this is a fact or not given the daughter reported that the fall happened this morning.  There is no proof to know when the fall happened but I wanted to discuss with hospice to see if there is any concerns and see if APS needs to be notified  10:40 AM [facility]- he is the POA was not aware she was here today. Did not know about the fall.    Troponin was negative CBC shows stable hemoglobin CMP shows stable creatinine CK negative.  UA with positive UTI will send for culture and place on ceftriaxone.  X-rays were negative.  CT imaging there is concern for meningioma could get an MRI but patient has no new focal deficits to suggest needing that-as well as a possible neoplastic process in the abdomen which I have discussed with patient and provided copies of reports  Given the concern for patient going back home given she is currently declining going to hospice I will discuss with social work to make sure that APS report is filed.  I discussed with social worker who stated that APS was not cleared patient to go back home at this time.  POA is at bedside and agrees that he does not feel like it safe for patient to go back home with the sister given  concerns that she has not been taking care of her.  The hospice team is talk to her further and hopefully patient will be willing to go to the hospice care center for a few days.  The son said that he would be willing to have her come back to her house or there is consideration of getting some other people in the home to try to help her.  We also considered admission for UTI if patient does not want to go to the hospice home.  Patient will be handed off to oncoming team pending further discussion between POA, patient and hospice team.  Incidental findings on CT imaging were discussed with family they report having a known meningioma that they have been watching she got no neurodeficit to suggest an acute MRI and we discussed the possible neoplastic process in the abdomen and follow-up outpatient for this as warranted.   The patient is on the cardiac monitor to evaluate for evidence of arrhythmia and/or significant heart rate changes.      FINAL CLINICAL IMPRESSION(S) / ED DIAGNOSES   Final diagnoses:  Fall, initial encounter  Urinary tract infection without hematuria, site unspecified     Rx / DC Orders   ED Discharge Orders     None        Note:  This document was prepared using Dragon voice recognition software and may include unintentional dictation errors.   Concha Se, MD 03/24/23 [phone]

## 2023-03-24 NOTE — Discharge Instructions (Addendum)
  IMPRESSION: 1. No acute traumatic injury in the chest, abdomen or pelvis. 2. Lesion or lesions located in the distal gastric body/antral region. Findings are concerning for a neoplastic process such as a gastrointestinal stromal tumor. In addition, there is an indeterminate lesion along the posterior right hepatic dome. If further workup is desired, recommend GI consultation and abdominal MRI with and without contrast. 3. Bilateral renal calculi without hydronephrosis. 4. Large amount of stool in the rectum. 5. Colonic diverticulosis without acute colonic inflammation. 6. Aortic Atherosclerosis (ICD10-I70.0).  IMPRESSION: 1. No evidence of acute intracranial or cervical spine abnormality. 2. When comparing across modalities, similar or mildly increased size of a plaque-like putative meningioma along the left frontal convexity. An MRI with contrast could further characterize if clinically warranted. 3. Severe multilevel degenerative change in the cervical spine.

## 2023-03-24 NOTE — ED Notes (Signed)
[facility]  called  for  transport  to  [facility]  care
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
## 2023-03-24 NOTE — Progress Notes (Addendum)
Insurance claims handler Liaison Note  Patient is currently followed by [employer] [facility] at home for hospice diagnosis of Parkinson's disease and dementia.  Patient had fall early this morning and was complaining of pain in her tailbone.  EMS was called for assistance out of the floor and patient requested transport to the hospital to be checked out.  Patient is scheduled for 5 day respite stay at the Hospice Home starting tomorrow for caregiver breakdown.  Daughter, [name] has asked for patient to start respite today after discharge from the ED.  Hospice IDT is working with caregiver for this request.  I spoke with Dr. Alfred Levins in the ED for report on patient and her request to have social history on patient.  Spoke with [name], SW (patient's hospice SW) who called and spoke with Dr. Alfred Levins.  Discussion had about APS referral.  Claris Gower called APS and spoke with the APS Social Worker  who reports that they are actually pursuing guardianship of this patient immediately. Their recommendation is that Pt not return to the home for safety reasons. They will be pursuing placement. This apparently has been an ongoing issue and this family is well known to APS.  APS Social Worker is [name] [phone]. She will be calling Dr. Alfred Levins in the ED for recommendations.  Report to [name], SW.  Continued collaboration with patient/family, hospice IDT and hospital team ongoing through final disposition.  Norris Cross, RN [phone]

## 2023-03-24 NOTE — ED Notes (Signed)
Report called to [facility] at [facility]

## 2023-03-24 NOTE — ED Triage Notes (Signed)
First nurse note: Arrived by [facility] from home. Reports mechanical fall. C/o tailbone pain. Ambulatory with assistance from EMS  Under hospice care

## 2023-03-24 NOTE — Progress Notes (Signed)
Insurance claims handler Liaison Note  Follow up with patient in ED.  Patient is resting quietly in bed.  She is very pleasant.  Son has been at bedside, but went to get patient some pants to put on for discharge.  Called and spoke with patient's son, Satina Jerrell who is POA.  He is agreeable for patient to go to hospice home for treatment of her UTI and respite stay until son can get things set up in his home for her.  Hospital records sent to [facility]. ED RN will call report to Hospice Home at [phone] ED will arrange EMS transport.  Please do not hesitate to call for any hospice related questions or concerns.  Norris Cross, RN [phone]

## 2023-03-25 LAB — URINE CULTURE: Culture: >=100000 — AB

## 2023-03-26 LAB — URINE CULTURE

## 2023-05-04 ENCOUNTER — Inpatient Hospital Stay
Admission: EM | Admit: 2023-05-04 | Discharge: 2023-06-02 | DRG: 871 | Disposition: E | Attending: Pulmonary Disease | Admitting: Pulmonary Disease

## 2023-05-04 ENCOUNTER — Emergency Department

## 2023-05-04 DIAGNOSIS — Z841 Family history of disorders of kidney and ureter: Secondary | ICD-10-CM

## 2023-05-04 DIAGNOSIS — I468 Cardiac arrest due to other underlying condition: Secondary | ICD-10-CM | POA: Diagnosis present

## 2023-05-04 DIAGNOSIS — J9601 Acute respiratory failure with hypoxia: Secondary | ICD-10-CM | POA: Diagnosis present

## 2023-05-04 DIAGNOSIS — A419 Sepsis, unspecified organism: Principal | ICD-10-CM

## 2023-05-04 DIAGNOSIS — Z803 Family history of malignant neoplasm of breast: Secondary | ICD-10-CM

## 2023-05-04 DIAGNOSIS — J189 Pneumonia, unspecified organism: Secondary | ICD-10-CM | POA: Diagnosis present

## 2023-05-04 DIAGNOSIS — Z7189 Other specified counseling: Secondary | ICD-10-CM

## 2023-05-04 DIAGNOSIS — Z79899 Other long term (current) drug therapy: Secondary | ICD-10-CM

## 2023-05-04 DIAGNOSIS — Z800 Family history of malignant neoplasm of digestive organs: Secondary | ICD-10-CM

## 2023-05-04 DIAGNOSIS — S2242XA Multiple fractures of ribs, left side, initial encounter for closed fracture: Secondary | ICD-10-CM | POA: Diagnosis present

## 2023-05-04 DIAGNOSIS — I10 Essential (primary) hypertension: Secondary | ICD-10-CM | POA: Diagnosis present

## 2023-05-04 DIAGNOSIS — R579 Shock, unspecified: Secondary | ICD-10-CM | POA: Diagnosis not present

## 2023-05-04 DIAGNOSIS — R6521 Severe sepsis with septic shock: Secondary | ICD-10-CM | POA: Diagnosis present

## 2023-05-04 DIAGNOSIS — Z66 Do not resuscitate: Secondary | ICD-10-CM | POA: Diagnosis present

## 2023-05-04 DIAGNOSIS — L8990 Pressure ulcer of unspecified site, unspecified stage: Secondary | ICD-10-CM | POA: Insufficient documentation

## 2023-05-04 DIAGNOSIS — G20A1 Parkinson's disease without dyskinesia, without mention of fluctuations: Secondary | ICD-10-CM | POA: Diagnosis present

## 2023-05-04 DIAGNOSIS — E119 Type 2 diabetes mellitus without complications: Secondary | ICD-10-CM | POA: Diagnosis present

## 2023-05-04 DIAGNOSIS — I498 Other specified cardiac arrhythmias: Secondary | ICD-10-CM | POA: Diagnosis present

## 2023-05-04 DIAGNOSIS — R001 Bradycardia, unspecified: Secondary | ICD-10-CM | POA: Diagnosis present

## 2023-05-04 DIAGNOSIS — F0280 Dementia in other diseases classified elsewhere without behavioral disturbance: Secondary | ICD-10-CM | POA: Diagnosis present

## 2023-05-04 DIAGNOSIS — Z7984 Long term (current) use of oral hypoglycemic drugs: Secondary | ICD-10-CM

## 2023-05-04 DIAGNOSIS — Z90710 Acquired absence of both cervix and uterus: Secondary | ICD-10-CM

## 2023-05-04 DIAGNOSIS — K7200 Acute and subacute hepatic failure without coma: Secondary | ICD-10-CM | POA: Diagnosis present

## 2023-05-04 DIAGNOSIS — Z515 Encounter for palliative care: Secondary | ICD-10-CM

## 2023-05-04 DIAGNOSIS — E785 Hyperlipidemia, unspecified: Secondary | ICD-10-CM | POA: Diagnosis present

## 2023-05-04 DIAGNOSIS — Z9049 Acquired absence of other specified parts of digestive tract: Secondary | ICD-10-CM

## 2023-05-04 DIAGNOSIS — Z91412 Personal history of adult neglect: Secondary | ICD-10-CM

## 2023-05-04 DIAGNOSIS — X58XXXA Exposure to other specified factors, initial encounter: Secondary | ICD-10-CM | POA: Diagnosis present

## 2023-05-04 DIAGNOSIS — Z87891 Personal history of nicotine dependence: Secondary | ICD-10-CM

## 2023-05-04 DIAGNOSIS — N19 Unspecified kidney failure: Secondary | ICD-10-CM | POA: Diagnosis present

## 2023-05-04 DIAGNOSIS — Z8249 Family history of ischemic heart disease and other diseases of the circulatory system: Secondary | ICD-10-CM

## 2023-05-04 DIAGNOSIS — Z7982 Long term (current) use of aspirin: Secondary | ICD-10-CM

## 2023-05-04 DIAGNOSIS — R7989 Other specified abnormal findings of blood chemistry: Secondary | ICD-10-CM | POA: Diagnosis present

## 2023-05-04 DIAGNOSIS — E872 Acidosis, unspecified: Secondary | ICD-10-CM | POA: Diagnosis present

## 2023-05-04 DIAGNOSIS — Z86011 Personal history of benign neoplasm of the brain: Secondary | ICD-10-CM

## 2023-05-04 DIAGNOSIS — Z79890 Hormone replacement therapy (postmenopausal): Secondary | ICD-10-CM

## 2023-05-04 LAB — CBC
HCT: 25 % — ABNORMAL LOW (ref 36.0–46.0)
Hemoglobin: 7.8 g/dL — ABNORMAL LOW (ref 12.0–15.0)
MCH: 27.9 pg (ref 26.0–34.0)
MCHC: 31.2 g/dL (ref 30.0–36.0)
MCV: 89.3 fL (ref 80.0–100.0)
Platelets: 262 10*3/uL (ref 150–400)
RBC: 2.8 MIL/uL — ABNORMAL LOW (ref 3.87–5.11)
RDW: 15.3 % (ref 11.5–15.5)
WBC: 26.3 10*3/uL — ABNORMAL HIGH (ref 4.0–10.5)
nRBC: 0.5 % — ABNORMAL HIGH (ref 0.0–0.2)

## 2023-05-04 LAB — COMPREHENSIVE METABOLIC PANEL
ALT: 234 U/L — ABNORMAL HIGH (ref 0–44)
AST: 3571 U/L — ABNORMAL HIGH (ref 15–41)
Albumin: 2 g/dL — ABNORMAL LOW (ref 3.5–5.0)
Alkaline Phosphatase: 97 U/L (ref 38–126)
Anion gap: 21 — ABNORMAL HIGH (ref 5–15)
BUN: 64 mg/dL — ABNORMAL HIGH (ref 8–23)
CO2: 9 mmol/L — ABNORMAL LOW (ref 22–32)
Calcium: 10.2 mg/dL (ref 8.9–10.3)
Chloride: 116 mmol/L — ABNORMAL HIGH (ref 98–111)
Creatinine, Ser: 1.54 mg/dL — ABNORMAL HIGH (ref 0.44–1.00)
GFR, Estimated: 32 mL/min — ABNORMAL LOW (ref >60)
Glucose, Bld: 78 mg/dL (ref 70–99)
Potassium: 4.8 mmol/L (ref 3.5–5.1)
Sodium: 146 mmol/L — ABNORMAL HIGH (ref 135–145)
Total Bilirubin: 0.8 mg/dL (ref 0.3–1.2)
Total Protein: 6 g/dL — ABNORMAL LOW (ref 6.5–8.1)

## 2023-05-04 LAB — LACTIC ACID, PLASMA
Lactic Acid, Venous: >9 mmol/L (ref 0.5–1.9)
Lactic Acid, Venous: >9 mmol/L (ref 0.5–1.9)

## 2023-05-04 LAB — TROPONIN I (HIGH SENSITIVITY)
Troponin I (High Sensitivity): 21 ng/L — ABNORMAL HIGH (ref <18)
Troponin I (High Sensitivity): 119 ng/L (ref <18)

## 2023-05-04 LAB — BLOOD GAS, ARTERIAL
Acid-base deficit: 27.3 mmol/L — ABNORMAL HIGH (ref 0.0–2.0)
Bicarbonate: 6.8 mmol/L — ABNORMAL LOW (ref 20.0–28.0)
FIO2: 40 %
MECHVT: 380 mL
Mechanical Rate: 16
O2 Saturation: 97.3 %
PEEP: 5 cmH2O
Patient temperature: 37
pCO2 arterial: 42 mmHg (ref 32–48)
pH, Arterial: <6.95 — CL (ref 7.35–7.45)
pO2, Arterial: 203 mmHg — ABNORMAL HIGH (ref 83–108)

## 2023-05-04 LAB — PROTIME-INR
INR: >10 (ref 0.8–1.2)
Prothrombin Time: >90 seconds — ABNORMAL HIGH (ref 11.4–15.2)

## 2023-05-04 LAB — CREATININE, SERUM
Creatinine, Ser: 1.43 mg/dL — ABNORMAL HIGH (ref 0.44–1.00)
GFR, Estimated: 35 mL/min — ABNORMAL LOW (ref >60)

## 2023-05-04 LAB — APTT: aPTT: >200 seconds (ref 24–36)

## 2023-05-04 LAB — BETA-HYDROXYBUTYRIC ACID: Beta-Hydroxybutyric Acid: 0.92 mmol/L — ABNORMAL HIGH (ref 0.05–0.27)

## 2023-05-04 MED ORDER — VANCOMYCIN HCL IN DEXTROSE 1-5 GM/200ML-% IV SOLN
1000.0000 mg | Freq: Once | INTRAVENOUS | Status: AC
  Administered 2023-05-04: 1000 mg via INTRAVENOUS
  Filled 2023-05-04: qty 200

## 2023-05-04 MED ORDER — LACTATED RINGERS IV SOLN: INTRAVENOUS | Status: DC

## 2023-05-04 MED ORDER — POLYVINYL ALCOHOL 1.4 % OP SOLN: 1.0000 [drp] | Freq: Four times a day (QID) | OPHTHALMIC | Status: DC | PRN

## 2023-05-04 MED ORDER — GLYCOPYRROLATE 0.2 MG/ML IJ SOLN: 0.2000 mg | INTRAMUSCULAR | Status: DC | PRN

## 2023-05-04 MED ORDER — HEPARIN SODIUM (PORCINE) 5000 UNIT/ML IJ SOLN: 5000.0000 [IU] | Freq: Three times a day (TID) | INTRAMUSCULAR | Status: DC

## 2023-05-04 MED ORDER — NOREPINEPHRINE 4 MG/250ML-% IV SOLN
INTRAVENOUS | Status: AC
  Administered 2023-05-04: 10 ug/min via INTRAVENOUS
  Filled 2023-05-04: qty 250

## 2023-05-04 MED ORDER — LACTATED RINGERS IV BOLUS
1000.0000 mL | Freq: Once | INTRAVENOUS | Status: AC
  Administered 2023-05-04: 1000 mL via INTRAVENOUS

## 2023-05-04 MED ORDER — VASOPRESSIN 20 UNITS/100 ML INFUSION FOR SHOCK
0.0300 [IU]/min | INTRAVENOUS | Status: DC
  Filled 2023-05-04: qty 100

## 2023-05-04 MED ORDER — ROCURONIUM BROMIDE 50 MG/5ML IV SOLN
INTRAVENOUS | Status: AC | PRN
  Administered 2023-05-04: 80 mg via INTRAVENOUS

## 2023-05-04 MED ORDER — ETOMIDATE 2 MG/ML IV SOLN
INTRAVENOUS | Status: AC | PRN
  Administered 2023-05-04: 15 mg via INTRAVENOUS

## 2023-05-04 MED ORDER — MIDAZOLAM HCL 2 MG/2ML IJ SOLN
2.0000 mg | Freq: Once | INTRAMUSCULAR | Status: AC
  Administered 2023-05-04: 2 mg via INTRAVENOUS
  Filled 2023-05-04: qty 2

## 2023-05-04 MED ORDER — FENTANYL CITRATE PF 50 MCG/ML IJ SOSY
50.0000 ug | PREFILLED_SYRINGE | Freq: Once | INTRAMUSCULAR | Status: AC
  Administered 2023-05-04: 50 ug via INTRAVENOUS
  Filled 2023-05-04: qty 1

## 2023-05-04 MED ORDER — METRONIDAZOLE 500 MG/100ML IV SOLN
500.0000 mg | Freq: Once | INTRAVENOUS | Status: AC
  Administered 2023-05-04: 500 mg via INTRAVENOUS
  Filled 2023-05-04: qty 100

## 2023-05-04 MED ORDER — SODIUM CHLORIDE 0.9 % IV SOLN
2.0000 g | Freq: Once | INTRAVENOUS | Status: AC
  Administered 2023-05-04: 2 g via INTRAVENOUS
  Filled 2023-05-04: qty 12.5

## 2023-05-04 MED ORDER — ACETAMINOPHEN 325 MG PO TABS: 650.0000 mg | ORAL_TABLET | Freq: Four times a day (QID) | ORAL | Status: DC | PRN

## 2023-05-04 MED ORDER — GLYCOPYRROLATE 1 MG PO TABS: 1.0000 mg | ORAL_TABLET | ORAL | Status: DC | PRN

## 2023-05-04 MED ORDER — MORPHINE SULFATE (PF) 4 MG/ML IV SOLN
4.0000 mg | Freq: Once | INTRAVENOUS | Status: AC
  Administered 2023-05-04: 4 mg via INTRAVENOUS
  Filled 2023-05-04: qty 1

## 2023-05-04 MED ORDER — ACETAMINOPHEN 325 MG RE SUPP: 650.0000 mg | Freq: Four times a day (QID) | RECTAL | Status: DC | PRN

## 2023-05-04 MED ORDER — SODIUM CHLORIDE 0.9 % IV SOLN: INTRAVENOUS | Status: DC

## 2023-05-04 MED ORDER — PHENYLEPHRINE 80 MCG/ML (10ML) SYRINGE FOR IV PUSH (FOR BLOOD PRESSURE SUPPORT)
PREFILLED_SYRINGE | INTRAVENOUS | Status: AC
  Filled 2023-05-04: qty 10

## 2023-05-04 MED ORDER — ROCURONIUM BROMIDE 10 MG/ML (PF) SYRINGE
PREFILLED_SYRINGE | INTRAVENOUS | Status: AC
  Filled 2023-05-04: qty 10

## 2023-05-04 MED ORDER — NOREPINEPHRINE 4 MG/250ML-% IV SOLN
0.0000 ug/min | INTRAVENOUS | Status: DC
  Filled 2023-05-04: qty 250

## 2023-05-04 MED ORDER — SODIUM CHLORIDE 0.9 % IV SOLN
INTRAVENOUS | Status: AC | PRN
  Administered 2023-05-04: 1000 mL via INTRAVENOUS

## 2023-05-04 MED ORDER — SODIUM CHLORIDE 0.9 % IV SOLN: 250.0000 mL | INTRAVENOUS | Status: DC

## 2023-05-04 MED ORDER — ETOMIDATE 2 MG/ML IV SOLN
INTRAVENOUS | Status: AC
  Filled 2023-05-04: qty 20

## 2023-06-02 NOTE — ED Notes (Signed)
ASdvised nurse that patient has ready bed
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
## 2023-06-02 NOTE — ED Notes (Addendum)
Family at bedside, denies any needs at this time

## 2023-06-02 NOTE — Sepsis Progress Note (Signed)
Blood cultures drawn around 1240 prior to antibiotics given per bedside nurse.

## 2023-06-02 NOTE — Sepsis Progress Note (Signed)
Code Sepsis monitoring discontinued due to cancellation.  Transitioned to comfort care

## 2023-06-02 NOTE — ED Notes (Signed)
Patient transported to CT 

## 2023-06-02 NOTE — ED Notes (Signed)
Pt compassionately extubated by RT. Comfort care per family request.

## 2023-06-02 NOTE — Consult Note (Signed)
CODE SEPSIS - PHARMACY COMMUNICATION  **Broad-spectrum antimicrobials should be administered within one hour of sepsis diagnosis**  Time Code Sepsis call or page was received: 1328  Antibiotics ordered: Cefepime, Vancomycin, Metronidazole  Time of first antibiotic administration: 1336  Additional action taken by pharmacy: N/A  If necessary, name of provider/nurse contacted: N/A    Will M. Dareen Piano, PharmD PGY-1 Pharmacy Resident 05/23/2023 1:51 PM

## 2023-06-02 NOTE — ED Notes (Signed)
Pt reading asystole on monitor, confirmed by Durene Cal RN and Lenise Arena. Dr. Scotty Court made aware.

## 2023-06-02 NOTE — H&P (Signed)
CRITICAL CARE PROGRESS NOTE    Name: Cathy Barton MRN: [id number] DOB: Nov 04, 1930     LOS: 0   SUBJECTIVE FINDINGS & SIGNIFICANT EVENTS    History of Presenting Illness:  This is a 89yo M with hx of parkinson's disease, HTN, dyslipidemia, uncontrolled DM who came in from home after apparently having cardiac arrest witnessed by her son.  He reported seeing this while she was in bathroom and he initiated CPR.  EMS had arrived and found patient to have PEA.  She received 3 rounds of EPI and ACLS.  She subsequently received transcutaneous pacing for bradyarrythmia.  ER doctor had spoken with son who explains patient is full code status despite being on home hospice. Family wants to be fully aggressive per ER physician.  PCCM admission for post cardiac arrest respiratory failure in circulatory shock.   Lines/tubes : Airway 7.5 mm (Active)  Secured at (cm) 21 cm 05/27/2023 1250  Measured From Lips 05/30/2023 1250  Secured Location Left 05/03/2023 1250  Secured By [employer] 05/24/2023 1250  Prone position No 05/03/2023 1250  Cuff Pressure (cm H2O) MOV (Manual Technique) 05/05/2023 1250     NG/OG Vented/Dual Lumen 14 Fr. Right nare Marking at nare/corner of mouth 70 cm (Active)  Tube Position (Required) Marking at nare/corner of mouth 05/14/2023 1252  Measurement (cm) (Required) 65 cm 05/23/2023 1252  Ongoing Placement Verification (Required) (See row information) Yes 05/21/2023 1251  Site Assessment Clean, Dry, Intact 05/29/2023 1251    Microbiology/Sepsis markers: Results for orders placed or performed during the hospital encounter of 03/24/23  Urine Culture     Status: Abnormal   Collection Time: 03/24/23  1:38 PM   Specimen: Urine, Clean Catch  Result Value Ref Range Status   Specimen Description   Final     URINE, CLEAN CATCH Performed at [facility], [address], [place], [place] [address]    Special Requests   Final    NONE Performed at [facility], [address] [place], [place] [address]    Culture >=100,000 COLONIES/mL ESCHERICHIA COLI (A)  Final   Report Status 03/26/2023 FINAL  Final   Organism ID, Bacteria ESCHERICHIA COLI (A)  Final      Susceptibility   Escherichia coli - MIC*    AMPICILLIN <=2 SENSITIVE Sensitive     CEFAZOLIN <=4 SENSITIVE Sensitive     CEFEPIME <=0.12 SENSITIVE Sensitive     CEFTRIAXONE <=0.25 SENSITIVE Sensitive     CIPROFLOXACIN <=0.25 SENSITIVE Sensitive     GENTAMICIN <=1 SENSITIVE Sensitive     IMIPENEM <=0.25 SENSITIVE Sensitive     NITROFURANTOIN <=16 SENSITIVE Sensitive     TRIMETH/SULFA <=20 SENSITIVE Sensitive     AMPICILLIN/SULBACTAM <=2 SENSITIVE Sensitive     PIP/TAZO <=4 SENSITIVE Sensitive     * >=100,000 COLONIES/mL ESCHERICHIA COLI    Anti-infectives:  Anti-infectives (From admission, onward)    Start     Dose/Rate Route Frequency Ordered Stop   05/29/2023 1330  ceFEPIme (MAXIPIME) 2 g in sodium chloride 0.9 % 100 mL IVPB        2 g 200 mL/hr over 30 Minutes Intravenous  Once 05/03/2023 1327 05/24/2023 1340   05/15/2023 1330  metroNIDAZOLE (FLAGYL) IVPB 500 mg        500 mg 100 mL/hr over 60 Minutes Intravenous  Once 05/27/2023 1327 05/06/2023 1403   05/25/2023 1330  vancomycin (VANCOCIN) IVPB 1000 mg/200 mL premix        1,000 mg  200 mL/hr over 60 Minutes Intravenous  Once 05/09/2023 1327          PAST MEDICAL HISTORY   Past Medical History:  Diagnosis Date   Diabetes mellitus without complication (HCC)    Hyperlipidemia    Hypertension    Meningioma (HCC)    Parkinson's disease    Followed by Neurology.     SURGICAL HISTORY   Past Surgical History:  Procedure Laterality Date   ABDOMINAL HYSTERECTOMY     APPENDECTOMY     CHOLECYSTECTOMY     SHOULDER SURGERY       FAMILY HISTORY   Family  History  Problem Relation Age of Onset   Breast cancer Mother 2   Heart disease Father    Healthy Sister    Cancer Brother        Colon CA   Kidney failure Brother    Cancer Brother        Prostate CA   Kidney failure Brother    Kidney failure Sister    Kidney failure Sister      SOCIAL HISTORY   Social History   Tobacco Use   Smoking status: Former    Packs/day: 0.25    Years: 3.00    Additional pack years: 0.00    Total pack years: 0.75    Types: Cigarettes   Smokeless tobacco: Never   Tobacco comments:    for a short time period  Vaping Use   Vaping Use: Never used  Substance Use Topics   Alcohol use: No    Alcohol/week: 0.0 standard drinks of alcohol   Drug use: No     MEDICATIONS   Current Medication:  Current Facility-Administered Medications:    etomidate (AMIDATE) 2 MG/ML injection, , , ,    lactated ringers infusion, , Intravenous, Continuous, Mumma, Shannon, MD, Last Rate: 150 mL/hr at 05/03/2023 1402, New Bag at 05/09/2023 1402   norepinephrine (LEVOPHED) 4mg  in (0.016 mg/mL) premix infusion, 0-40 mcg/min, Intravenous, Continuous, Mumma, Shannon, MD, Last Rate: 75 mL/hr at 05/23/2023 1302, 20 mcg/min at 05/31/2023 1302   phenylephrine 80 mcg/10 mL injection, , , ,    rocuronium bromide 100 MG/10ML SOSY, , , ,    vancomycin (VANCOCIN) IVPB 1000 mg/200 mL premix, 1,000 mg, Intravenous, Once, Mumma, Shannon, MD, Last Rate: 200 mL/hr at 05/03/2023 1343, 1,000 mg at 05/06/2023 1343  Current Outpatient Medications:    Amantadine HCl 100 MG tablet, , Disp: , Rfl: 5   aspirin 81 MG tablet, Take 81 mg by mouth daily. , Disp: , Rfl:    carbidopa-levodopa (SINEMET CR) 50-200 MG tablet, Take 1 tablet by mouth at bedtime. (Patient not taking: Reported on 03/24/2023), Disp: , Rfl:    carbidopa-levodopa (SINEMET IR) 25-100 MG tablet, Take 2 tablets by mouth 3 (three) times daily., Disp: , Rfl: 1   glipiZIDE (GLUCOTROL XL) 2.5 MG 24 hr tablet, Take 2.5 mg by mouth daily.  (Patient not taking: Reported on 03/24/2023), Disp: , Rfl:    HYDROcodone-acetaminophen (NORCO) 10-325 MG tablet, Take 1 tablet by mouth every 6 (six) hours as needed for severe pain. (Patient not taking: Reported on 03/24/2023), Disp: 10 tablet, Rfl: 0   losartan (COZAAR) 25 MG tablet, Take 25 mg by mouth daily., Disp: , Rfl:    losartan (COZAAR) 50 MG tablet, TAKE 1 TABLET(50 MG) BY MOUTH DAILY (Patient not taking: Reported on 03/24/2023), Disp: 90 tablet, Rfl: 0   metFORMIN (GLUCOPHAGE) 500 MG tablet, TAKE 1 TABLET(500 MG) BY  MOUTH TWICE DAILY WITH A MEAL (Patient not taking: Reported on 03/24/2023), Disp: 180 tablet, Rfl: 0   mirtazapine (REMERON) 7.5 MG tablet, Take 7.5 mg by mouth at bedtime. (Patient not taking: Reported on 03/24/2023), Disp: , Rfl:    pantoprazole (PROTONIX) 20 MG tablet, TAKE 1 TABLET(20 MG) BY MOUTH DAILY (Patient not taking: Reported on 03/24/2023), Disp: 90 tablet, Rfl: 0   pravastatin (PRAVACHOL) 40 MG tablet, Take 1 tablet (40 mg total) by mouth at bedtime. (Patient not taking: Reported on 03/24/2023), Disp: 30 tablet, Rfl: 0   ranitidine (ZANTAC) 150 MG capsule, Take 1 capsule by mouth daily as needed. (Patient not taking: Reported on 03/24/2023), Disp: , Rfl:    rOPINIRole (REQUIP) 0.25 MG tablet, Take 0.5 mg by mouth 2 (two) times daily. (Patient not taking: Reported on 03/24/2023), Disp: , Rfl:    sitaGLIPtin (JANUVIA) 100 MG tablet, Take 1 tablet (100 mg total) by mouth daily. (Patient not taking: Reported on 03/24/2023), Disp: 30 tablet, Rfl: 0    ALLERGIES   Codeine    REVIEW OF SYSTEMS     10 point ROS done and is negative except as per HPI  PHYSICAL EXAMINATION   Vital Signs: Pulse Rate:  [70-73] 73 (06/02 1237) Resp:  [11] 11 (06/02 1237) BP: (84-92)/(40-48) 92/48 (06/02 1237) SpO2:  [100 %] 100 % (06/02 1250) FiO2 (%):  [40 %] 40 % (06/02 1250) Weight:  [57 kg] 57 kg (06/02 1257)  GENERAL:age appropriate NCAT HEAD: Normocephalic, atraumatic.   EYES: Pupils equal, round, reactive to light.  No scleral icterus.  MOUTH: Moist mucosal membrane. NECK: Supple. No thyromegaly. No nodules. No JVD.  PULMONARY: rhonchi worse on left CARDIOVASCULAR: S1 and S2. Regular rate and rhythm. No murmurs, rubs, or gallops.  GASTROINTESTINAL: Soft, nontender, non-distended. No masses. Positive bowel sounds. No hepatosplenomegaly.  MUSCULOSKELETAL: No swelling, clubbing, or edema.  NEUROLOGIC: Mild distress due to acute illness SKIN:intact,warm,dry   PERTINENT DATA     Infusions:  lactated ringers 150 mL/hr at 05/03/2023 1402   norepinephrine (LEVOPHED) Adult infusion 20 mcg/min (05/06/2023 1302)   vancomycin 1,000 mg (05/24/2023 1343)   Scheduled Medications:  etomidate       phenylephrine       rocuronium bromide       PRN Medications: etomidate, phenylephrine, rocuronium bromide Hemodynamic parameters:   Intake/Output: No intake/output data recorded.  Ventilator  Settings: Vent Mode: AC FiO2 (%):  [40 %] 40 % Set Rate:  [16 bmp] 16 bmp Vt Set:  [380 mL] 380 mL PEEP:  [5 cmH20] 5 cmH20   LAB RESULTS:  Basic Metabolic Panel: Recent Labs  Lab 05/09/2023 1241  NA 146*  K 4.8  CL 116*  CO2 9*  GLUCOSE 78  BUN 64*  CREATININE 1.54*  CALCIUM 10.2   Liver Function Tests: Recent Labs  Lab 05/10/2023 1241  AST 3,571*  ALT 234*  ALKPHOS 97  BILITOT 0.8  PROT 6.0*  ALBUMIN 2.0*   No results for input(s): "LIPASE", "AMYLASE" in the last 168 hours. No results for input(s): "AMMONIA" in the last 168 hours. CBC: Recent Labs  Lab 05/11/2023 1241  WBC 26.3*  HGB 7.8*  HCT 25.0*  MCV 89.3  PLT 262   Cardiac Enzymes: No results for input(s): "CKTOTAL", "CKMB", "CKMBINDEX", "TROPONINI" in the last 168 hours. BNP: Invalid input(s): "POCBNP" CBG: No results for input(s): "GLUCAP" in the last 168 hours.     IMAGING RESULTS:     ASSESSMENT AND PLAN    -  Multidisciplinary rounds held today  Acute Hypoxic Respiratory  Failure -CT chest with consolidation of LLL worse posteriorly consistent with PNA - treat for CAP with rocephin/zithromax-s/p cefepime flagyl -IVF -Blood cultures -CRP -MRSA screen -Procalcitonin -continue Full MV support -continue Bronchodilator Therapy -Wean Fio2 and PEEP as tolerated -will perform SAT/SBT when respiratory parameters are met   S/p Cardiac arrest  -unclear reason for MI at this time, possible septic shock related  -lactate >9 -follow up cardiac bimarkers ICU telemtery monitoring  Renal Failure-most likely due ischemia from shock -follow chem 7 -follow UO -continue Foley Catheter-assess need daily   NEUROLOGY - intubated and sedated - minimal sedation to achieve a RASS goal: -1 Wake up assessment pending   Septic shock -use vasopressors to keep MAP>65 -follow ABG and LA -follow up cultures -emperic ABX -consider stress dose steroids -severe metabolic acidosis -bicarb 1 amp -antibiotics -vasopressor support -palliative care evaluation  Shock liver   - IVF, reverse sepsis and shock  Parkinsons and dementia     Chronic problem - supportive care    ID -continue IV abx as prescibed -follow up cultures  GI/Nutrition GI PROPHYLAXIS as indicated DIET-->TF's as tolerated Constipation protocol as indicated  ENDO - ICU hypoglycemic\Hyperglycemia protocol -check FSBS per protocol   ELECTROLYTES -follow labs as needed -replace as needed -pharmacy consultation   DVT/GI PRX ordered -SCDs  TRANSFUSIONS AS NEEDED MONITOR FSBS ASSESS the need for LABS as needed    Critical care provider statement:   Total critical care time: 33 minutes   Performed by: Karna Christmas MD   Critical care time was exclusive of separately billable procedures and treating other patients.   Critical care was necessary to treat or prevent imminent or life-threatening deterioration.   Critical care was time spent personally by me on the following activities:  development of treatment plan with patient and/or surrogate as well as nursing, discussions with consultants, evaluation of patient's response to treatment, examination of patient, obtaining history from patient or surrogate, ordering and performing treatments and interventions, ordering and review of laboratory studies, ordering and review of radiographic studies, pulse oximetry and re-evaluation of patient's condition.    Vida Rigger, M.D.  Pulmonary & Critical Care Medicine

## 2023-06-02 NOTE — Sepsis Progress Note (Signed)
Sepsis protocol is being followed by [facility]. 

## 2023-06-02 NOTE — ED Provider Notes (Signed)
[facility] Provider Note    Event Date/Time   First MD Initiated Contact with Patient 05/07/2023 1249     (approximate)   History   unresponsive   HPI  Cathy Barton is a 89 y.o. female past medical history significant for Parkinson's and dementia who presents to the emergency department after witnessed arrest by her son.  EMS stated that the patient was using the bathroom and then had a witnessed arrest by her son.  He initiated CPR and fire with initiating CPR when EMS arrived.  Received 3 rounds of epinephrine, bicarb and calcium with ROSC.  Initial rhythm asystole and then PEA.  Bradycardic with a heart rate in the low 40s so initiated transcutaneous pacing and received electrical and mechanical capture.  Family members were adamant that the patient is followed by hospice but that she is full code and to do everything.       Physical Exam   Triage Vital Signs: ED Triage Vitals  Enc Vitals Group     BP 05/07/2023 1226 (!) 84/40     Pulse Rate 05/17/2023 1226 70     Resp 05/10/2023 1237 11     Temp --      Temp src --      SpO2 05/25/2023 1237 100 %     Weight --      Height --      Head Circumference --      Peak Flow --      Pain Score --      Pain Loc --      Pain Edu? --      Excl. in GC? --     Most recent vital signs: Vitals:   05/05/2023 1430 05/23/2023 1511  BP: (!) 86/48 (!) 85/49  Pulse: 65   Resp: (!) 22   SpO2: 100%     Physical Exam Constitutional:      Appearance: She is well-developed. She is ill-appearing and toxic-appearing.  HENT:     Head: Atraumatic.  Eyes:     Conjunctiva/sclera: Conjunctivae normal.     Comments: 2 mm and reactive  Cardiovascular:     Rate and Rhythm: Regular rhythm.  Pulmonary:     Effort: No respiratory distress.     Comments: LMA in place.  Lungs are clear to auscultation Abdominal:     General: There is no distension.     Tenderness: There is no abdominal tenderness.  Musculoskeletal:         General: Normal range of motion.     Right lower leg: No edema.     Left lower leg: No edema.  Skin:    General: Skin is warm.     Capillary Refill: Capillary refill takes 2 to 3 seconds.     IMPRESSION / MDM / ASSESSMENT AND PLAN / ED COURSE  I reviewed the triage vital signs and the nursing notes.  Patient arrived post CPR.  Patient arrived being paced, stop transcutaneous pacing and patient had a good pulse.  Significantly hypotensive so started on Levophed at 21.  LMA in place, RSI for definitive airway  Postintubation given Versed and fentanyl for sedation.  Given her soft blood pressure on 20 of Levophed we will hold off on propofol at this time.  EKG  I, Corena Herter, the attending physician, personally viewed and interpreted this ECG.   Rate: Normal  Rhythm: Normal sinus  Axis: Normal  Intervals: Normal  ST&T Change: None  No tachycardic or bradycardic dysrhythmias while on cardiac telemetry.  RADIOLOGY I independently reviewed imaging, my interpretation of imaging: Chest x-ray with ET tube in place.  Recommended further insertion of OG tube.  Left lateral rib fractures.    LABS (all labs ordered are listed, but only abnormal results are displayed) Labs interpreted as -    Labs Reviewed  CBC - Abnormal; Notable for the following components:      Result Value   WBC 26.3 (*)    RBC 2.80 (*)    Hemoglobin 7.8 (*)    HCT 25.0 (*)    nRBC 0.5 (*)    All other components within normal limits  COMPREHENSIVE METABOLIC PANEL - Abnormal; Notable for the following components:   Sodium 146 (*)    Chloride 116 (*)    CO2 9 (*)    BUN 64 (*)    Creatinine, Ser 1.54 (*)    Total Protein 6.0 (*)    Albumin 2.0 (*)    AST 3,571 (*)    ALT 234 (*)    GFR, Estimated 32 (*)    Anion gap 21 (*)    All other components within normal limits  LACTIC ACID, PLASMA - Abnormal; Notable for the following components:   Lactic Acid, Venous >9.0 (*)    All other components  within normal limits  LACTIC ACID, PLASMA - Abnormal; Notable for the following components:   Lactic Acid, Venous >9.0 (*)    All other components within normal limits  BLOOD GAS, ARTERIAL - Abnormal; Notable for the following components:   pH, Arterial <6.95 (*)    pO2, Arterial 203 (*)    Bicarbonate 6.8 (*)    Acid-base deficit 27.3 (*)    All other components within normal limits  BETA-HYDROXYBUTYRIC ACID - Abnormal; Notable for the following components:   Beta-Hydroxybutyric Acid 0.92 (*)    All other components within normal limits  TROPONIN I (HIGH SENSITIVITY) - Abnormal; Notable for the following components:   Troponin I (High Sensitivity) 21 (*)    All other components within normal limits  CULTURE, BLOOD (ROUTINE X 2)  CULTURE, BLOOD (ROUTINE X 2)  URINALYSIS, W/ REFLEX TO CULTURE (INFECTION SUSPECTED)  PROTIME-INR  APTT  TROPONIN I (HIGH SENSITIVITY)     MDM  Initial lactic acid significantly elevated at greater than 9.  Started on sepsis protocol.  Given 30 cc/kg of IV fluids and started on broad-spectrum antibiotics to cover unknown source.  Will obtain CT scan of the head and CT chest abdomen pelvis.  pH less than 6.  Significantly elevated anion gap of 21 which is likely in the setting of lactic acidosis.  However patient does appear to be on a sulfonylurea, only mildly elevated beta hydroxybutyrate's, will hold off on insulin infusion for new glycemic DKA at this time.  More likely in the setting of sepsis and septic shock.  Significantly elevated LFTs, lactic acid greater than 9.  Given broad-spectrum antibiotics.  Continued on Levophed infusion, continued to be hypotensive so started on vasopressin.  Made DNR from son over the phone.  Discussed with the son I did not believe that she would survive this hospitalization.  Consulted ICU for admission.     PROCEDURES:  Critical Care performed: yes  .Critical Care  Performed by: Corena Herter,  MD Authorized by: Corena Herter, MD   Critical care provider statement:    Critical care time (minutes):  60   Critical care time was exclusive of:  Separately billable procedures and treating other patients   Critical care was necessary to treat or prevent imminent or life-threatening deterioration of the following conditions:  Shock   Critical care was time spent personally by me on the following activities:  Development of treatment plan with patient or surrogate, discussions with consultants, evaluation of patient's response to treatment, examination of patient, ordering and review of laboratory studies, ordering and review of radiographic studies, ordering and performing treatments and interventions, pulse oximetry, re-evaluation of patient's condition and review of old charts Procedure Name: Intubation Date/Time: 05/21/2023 3:15 PM  Performed by: Corena Herter, MDPre-anesthesia Checklist: Suction available, Emergency Drugs available, Patient identified, Patient being monitored and Timeout performed Preoxygenation: LMA in place and being bagged. Induction Type: Rapid sequence Ventilation: Mask ventilation without difficulty Laryngoscope Size: Glidescope and 3 Grade View: Grade I Tube size: 7.5 mm Number of attempts: 1 Airway Equipment and Method: Video-laryngoscopy Placement Confirmation: ETT inserted through vocal cords under direct vision, Positive ETCO2, CO2 detector and Breath sounds checked- equal and bilateral Secured at: 23 cm Tube secured with: ETT holder Dental Injury: Teeth and Oropharynx as per pre-operative assessment  Future Recommendations: Recommend- induction with short-acting agent, and alternative techniques readily available      Patient's presentation is most consistent with acute presentation with potential threat to life or bodily function.   MEDICATIONS ORDERED IN ED: Medications  norepinephrine (LEVOPHED) 4mg  in (0.016 mg/mL) premix infusion (20  mcg/min Intravenous Rate/Dose Change 05/18/2023 1302)  rocuronium bromide 100 MG/10ML SOSY (  See Procedure Record 05/21/2023 1240)  etomidate (AMIDATE) 2 MG/ML injection (  See Procedure Record 05/27/2023 1240)  phenylephrine 80 mcg/10 mL injection (  Not Given 06/01/2023 1249)  lactated ringers infusion ( Intravenous New Bag/Given 05/06/2023 1402)  0.9 %  sodium chloride infusion (has no administration in time range)  vasopressin (PITRESSIN) 20 Units in sodium chloride 0.9 % 100 mL infusion-*FOR SHOCK* (has no administration in time range)  0.9 %  sodium chloride infusion (0 mLs Intravenous Stopped 05/24/2023 1306)  etomidate (AMIDATE) injection (15 mg Intravenous Given 05/20/2023 1240)  rocuronium (ZEMURON) injection (80 mg Intravenous Given 05/11/2023 1241)  lactated ringers bolus 1,000 mL (0 mLs Intravenous Stopped 05/19/2023 1356)  ceFEPIme (MAXIPIME) 2 g in sodium chloride 0.9 % 100 mL IVPB (0 g Intravenous Stopped 05/11/2023 1340)  metroNIDAZOLE (FLAGYL) IVPB 500 mg (0 mg Intravenous Stopped 05/12/2023 1403)  vancomycin (VANCOCIN) IVPB 1000 mg/200 mL premix (1,000 mg Intravenous New Bag/Given 05/24/2023 1343)  midazolam (VERSED) injection 2 mg (2 mg Intravenous Given 05/25/2023 1356)  fentaNYL (SUBLIMAZE) injection 50 mcg (50 mcg Intravenous Given 05/05/2023 1357)    FINAL CLINICAL IMPRESSION(S) / ED DIAGNOSES   Final diagnoses:  Septic shock (HCC)     Rx / DC Orders   ED Discharge Orders     None        Note:  This document was prepared using Dragon voice recognition software and may include unintentional dictation errors.   Corena Herter, MD 05/19/2023 [phone]

## 2023-06-02 NOTE — Sepsis Progress Note (Signed)
Notified provider of need to consider ordering a repeat lactic acid with the last lactic acid >9.

## 2023-06-02 NOTE — ED Triage Notes (Signed)
Pt to ED via [facility] from Home. Pt sitting on tbedside commode and went unresponsive. Son started CPR, pt asystole on EMS arrival. Pt paced at 120 given 3x Epi, Amp of Bicarb, 1G of calcium.

## 2023-06-02 NOTE — Consult Note (Signed)
PHARMACY - BRIEF ANTIBIOTIC NOTE   Pharmacy has received consult(s) for cefepime and vancomycin from an ED provider. The patient's profile has been reviewed for ht/wt/allergies/indication/available labs.    One time order(s) placed for cefepime 2 g IV and vancomycin 1000 mg IV  Further antibiotics/pharmacy consults should be ordered by admitting physician if indicated.                       Thank you,  Will M. Dareen Piano, PharmD PGY-1 Pharmacy Resident 05/13/2023 1:53 PM
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
## 2023-06-02 NOTE — ED Notes (Addendum)
Per Hospice and Diannia Ruder, told to hold pressers at this time. Family is wanting to transition to comfort care.

## 2023-06-02 NOTE — ED Notes (Signed)
NG tube pushed another 10cm. XR to verify placement

## 2023-06-02 DEATH — deceased
